# Patient Record
Sex: Male | Born: 1975 | Race: Black or African American | Hispanic: No | Marital: Single | Smoking: Never smoker
Health system: Southern US, Community
[De-identification: ages and names within clinical notes are randomized; demographics above are authoritative.]

## PROBLEM LIST (undated history)

## (undated) DIAGNOSIS — J45909 Unspecified asthma, uncomplicated: Secondary | ICD-10-CM

## (undated) HISTORY — PX: HAND SURGERY: SHX662

---

## 1997-10-18 ENCOUNTER — Emergency Department (HOSPITAL_COMMUNITY): Admission: EM | Admit: 1997-10-18 | Discharge: 1997-10-18 | Payer: Self-pay | Admitting: Emergency Medicine

## 1997-12-22 ENCOUNTER — Encounter: Admission: RE | Admit: 1997-12-22 | Discharge: 1997-12-22 | Payer: Self-pay | Admitting: Family Medicine

## 1998-02-01 ENCOUNTER — Encounter: Admission: RE | Admit: 1998-02-01 | Discharge: 1998-02-01 | Payer: Self-pay | Admitting: Family Medicine

## 1998-02-03 ENCOUNTER — Encounter: Admission: RE | Admit: 1998-02-03 | Discharge: 1998-02-03 | Payer: Self-pay | Admitting: Family Medicine

## 1998-02-28 ENCOUNTER — Encounter: Admission: RE | Admit: 1998-02-28 | Discharge: 1998-02-28 | Payer: Self-pay | Admitting: Family Medicine

## 1998-10-09 ENCOUNTER — Emergency Department (HOSPITAL_COMMUNITY): Admission: EM | Admit: 1998-10-09 | Discharge: 1998-10-09 | Payer: Self-pay | Admitting: Emergency Medicine

## 1998-10-09 ENCOUNTER — Encounter: Payer: Self-pay | Admitting: Emergency Medicine

## 1998-11-10 ENCOUNTER — Emergency Department (HOSPITAL_COMMUNITY): Admission: EM | Admit: 1998-11-10 | Discharge: 1998-11-10 | Payer: Self-pay | Admitting: Emergency Medicine

## 1998-11-21 ENCOUNTER — Encounter: Admission: RE | Admit: 1998-11-21 | Discharge: 1998-11-21 | Payer: Self-pay | Admitting: Family Medicine

## 1998-12-06 ENCOUNTER — Encounter: Admission: RE | Admit: 1998-12-06 | Discharge: 1998-12-06 | Payer: Self-pay | Admitting: Family Medicine

## 1998-12-13 ENCOUNTER — Emergency Department (HOSPITAL_COMMUNITY): Admission: EM | Admit: 1998-12-13 | Discharge: 1998-12-13 | Payer: Self-pay | Admitting: Emergency Medicine

## 1998-12-13 ENCOUNTER — Encounter: Payer: Self-pay | Admitting: Emergency Medicine

## 1999-02-10 ENCOUNTER — Emergency Department (HOSPITAL_COMMUNITY): Admission: EM | Admit: 1999-02-10 | Discharge: 1999-02-10 | Payer: Self-pay | Admitting: *Deleted

## 1999-05-03 ENCOUNTER — Encounter: Admission: RE | Admit: 1999-05-03 | Discharge: 1999-05-03 | Payer: Self-pay | Admitting: Family Medicine

## 2000-03-26 ENCOUNTER — Emergency Department (HOSPITAL_COMMUNITY): Admission: EM | Admit: 2000-03-26 | Discharge: 2000-03-26 | Payer: Self-pay

## 2004-04-03 ENCOUNTER — Emergency Department (HOSPITAL_COMMUNITY): Admission: EM | Admit: 2004-04-03 | Discharge: 2004-04-03 | Payer: Self-pay | Admitting: Emergency Medicine

## 2004-05-02 ENCOUNTER — Emergency Department (HOSPITAL_COMMUNITY): Admission: EM | Admit: 2004-05-02 | Discharge: 2004-05-02 | Payer: Self-pay | Admitting: Family Medicine

## 2005-04-02 ENCOUNTER — Emergency Department (HOSPITAL_COMMUNITY): Admission: EM | Admit: 2005-04-02 | Discharge: 2005-04-02 | Payer: Self-pay | Admitting: Emergency Medicine

## 2005-04-04 ENCOUNTER — Emergency Department (HOSPITAL_COMMUNITY): Admission: EM | Admit: 2005-04-04 | Discharge: 2005-04-04 | Payer: Self-pay | Admitting: Emergency Medicine

## 2006-03-31 ENCOUNTER — Emergency Department (HOSPITAL_COMMUNITY): Admission: EM | Admit: 2006-03-31 | Discharge: 2006-03-31 | Payer: Self-pay | Admitting: Emergency Medicine

## 2007-07-10 ENCOUNTER — Emergency Department (HOSPITAL_COMMUNITY): Admission: EM | Admit: 2007-07-10 | Discharge: 2007-07-10 | Payer: Self-pay | Admitting: Emergency Medicine

## 2007-08-15 ENCOUNTER — Emergency Department (HOSPITAL_COMMUNITY): Admission: EM | Admit: 2007-08-15 | Discharge: 2007-08-16 | Payer: Self-pay | Admitting: Emergency Medicine

## 2007-08-21 ENCOUNTER — Emergency Department (HOSPITAL_COMMUNITY): Admission: EM | Admit: 2007-08-21 | Discharge: 2007-08-21 | Payer: Self-pay | Admitting: Emergency Medicine

## 2007-12-09 ENCOUNTER — Emergency Department (HOSPITAL_COMMUNITY): Admission: EM | Admit: 2007-12-09 | Discharge: 2007-12-09 | Payer: Self-pay | Admitting: Family Medicine

## 2009-05-13 ENCOUNTER — Emergency Department (HOSPITAL_COMMUNITY): Admission: EM | Admit: 2009-05-13 | Discharge: 2009-05-13 | Payer: Self-pay | Admitting: Emergency Medicine

## 2009-09-30 ENCOUNTER — Emergency Department (HOSPITAL_COMMUNITY): Admission: EM | Admit: 2009-09-30 | Discharge: 2009-09-30 | Payer: Self-pay | Admitting: Emergency Medicine

## 2009-10-23 ENCOUNTER — Emergency Department (HOSPITAL_COMMUNITY): Admission: EM | Admit: 2009-10-23 | Discharge: 2009-10-23 | Payer: Self-pay | Admitting: Emergency Medicine

## 2010-07-15 LAB — URINALYSIS, ROUTINE W REFLEX MICROSCOPIC
Bilirubin Urine: NEGATIVE
Glucose, UA: NEGATIVE mg/dL
Ketones, ur: NEGATIVE mg/dL
Leukocytes, UA: NEGATIVE
Nitrite: NEGATIVE
Protein, ur: NEGATIVE mg/dL
Specific Gravity, Urine: 1.01 (ref 1.005–1.030)
Urobilinogen, UA: 1 mg/dL (ref 0.0–1.0)
pH: 7.5 (ref 5.0–8.0)

## 2010-07-15 LAB — URINE MICROSCOPIC-ADD ON

## 2010-07-15 LAB — RAPID STREP SCREEN (MED CTR MEBANE ONLY): Streptococcus, Group A Screen (Direct): POSITIVE — AB

## 2010-11-07 ENCOUNTER — Emergency Department (HOSPITAL_COMMUNITY)
Admission: EM | Admit: 2010-11-07 | Discharge: 2010-11-07 | Disposition: A | Discharge status: Home / Self Care | Payer: Self-pay | Attending: Emergency Medicine | Admitting: Emergency Medicine

## 2010-11-07 DIAGNOSIS — J45909 Unspecified asthma, uncomplicated: Secondary | ICD-10-CM | POA: Insufficient documentation

## 2010-11-07 DIAGNOSIS — J069 Acute upper respiratory infection, unspecified: Secondary | ICD-10-CM | POA: Insufficient documentation

## 2013-07-04 ENCOUNTER — Emergency Department (HOSPITAL_COMMUNITY): Payer: Self-pay

## 2013-07-04 ENCOUNTER — Encounter (HOSPITAL_COMMUNITY): Payer: Self-pay | Admitting: Emergency Medicine

## 2013-07-04 ENCOUNTER — Emergency Department (HOSPITAL_COMMUNITY)
Admission: EM | Admit: 2013-07-04 | Discharge: 2013-07-05 | Disposition: A | Discharge status: Home / Self Care | Payer: Self-pay | Attending: Emergency Medicine | Admitting: Emergency Medicine

## 2013-07-04 DIAGNOSIS — R05 Cough: Secondary | ICD-10-CM | POA: Insufficient documentation

## 2013-07-04 DIAGNOSIS — B349 Viral infection, unspecified: Secondary | ICD-10-CM

## 2013-07-04 DIAGNOSIS — R52 Pain, unspecified: Secondary | ICD-10-CM | POA: Insufficient documentation

## 2013-07-04 DIAGNOSIS — B9789 Other viral agents as the cause of diseases classified elsewhere: Secondary | ICD-10-CM | POA: Insufficient documentation

## 2013-07-04 DIAGNOSIS — R059 Cough, unspecified: Secondary | ICD-10-CM | POA: Insufficient documentation

## 2013-07-04 DIAGNOSIS — K1370 Unspecified lesions of oral mucosa: Secondary | ICD-10-CM | POA: Insufficient documentation

## 2013-07-04 DIAGNOSIS — R6889 Other general symptoms and signs: Secondary | ICD-10-CM | POA: Insufficient documentation

## 2013-07-04 DIAGNOSIS — J45909 Unspecified asthma, uncomplicated: Secondary | ICD-10-CM | POA: Insufficient documentation

## 2013-07-04 HISTORY — DX: Unspecified asthma, uncomplicated: J45.909

## 2013-07-04 LAB — CBC WITH DIFFERENTIAL/PLATELET
BASOS ABS: 0 10*3/uL (ref 0.0–0.1)
Basophils Relative: 0 % (ref 0–1)
Eosinophils Absolute: 0.1 10*3/uL (ref 0.0–0.7)
Eosinophils Relative: 1 % (ref 0–5)
HCT: 40.4 % (ref 39.0–52.0)
Hemoglobin: 13.8 g/dL (ref 13.0–17.0)
LYMPHS PCT: 24 % (ref 12–46)
Lymphs Abs: 1.3 10*3/uL (ref 0.7–4.0)
MCH: 29.7 pg (ref 26.0–34.0)
MCHC: 34.2 g/dL (ref 30.0–36.0)
MCV: 86.9 fL (ref 78.0–100.0)
Monocytes Absolute: 0.9 10*3/uL (ref 0.1–1.0)
Monocytes Relative: 16 % — ABNORMAL HIGH (ref 3–12)
NEUTROS ABS: 3.2 10*3/uL (ref 1.7–7.7)
NEUTROS PCT: 58 % (ref 43–77)
PLATELETS: 209 10*3/uL (ref 150–400)
RBC: 4.65 MIL/uL (ref 4.22–5.81)
RDW: 13.2 % (ref 11.5–15.5)
WBC: 5.5 10*3/uL (ref 4.0–10.5)

## 2013-07-04 LAB — BASIC METABOLIC PANEL
BUN: 9 mg/dL (ref 6–23)
BUN: 11 mg/dL (ref 6–23)
CALCIUM: 8.9 mg/dL (ref 8.4–10.5)
CHLORIDE: 100 meq/L (ref 96–112)
CO2: 25 mEq/L (ref 19–32)
CO2: 23 mEq/L (ref 19–32)
CREATININE: 1.32 mg/dL (ref 0.50–1.35)
Calcium: 8.9 mg/dL (ref 8.4–10.5)
Chloride: 101 mEq/L (ref 96–112)
Creatinine, Ser: 1.31 mg/dL (ref 0.50–1.35)
GFR calc Af Amer: 80 mL/min — ABNORMAL LOW (ref >90)
GFR calc non Af Amer: 69 mL/min — ABNORMAL LOW (ref >90)
GFR calc non Af Amer: 69 mL/min — ABNORMAL LOW (ref >90)
GFR, EST AFRICAN AMERICAN: 80 mL/min — AB (ref >90)
Glucose, Bld: 127 mg/dL — ABNORMAL HIGH (ref 70–99)
Glucose, Bld: 114 mg/dL — ABNORMAL HIGH (ref 70–99)
Potassium: 3.6 mEq/L — ABNORMAL LOW (ref 3.7–5.3)
Potassium: 3.7 mEq/L (ref 3.7–5.3)
SODIUM: 138 meq/L (ref 137–147)
Sodium: 138 mEq/L (ref 137–147)

## 2013-07-04 LAB — URINALYSIS, ROUTINE W REFLEX MICROSCOPIC
BILIRUBIN URINE: NEGATIVE
Glucose, UA: NEGATIVE mg/dL
Hgb urine dipstick: NEGATIVE
KETONES UR: NEGATIVE mg/dL
Leukocytes, UA: NEGATIVE
NITRITE: NEGATIVE
PH: 8 (ref 5.0–8.0)
PROTEIN: 100 mg/dL — AB
Specific Gravity, Urine: 1.022 (ref 1.005–1.030)
Urobilinogen, UA: 1 mg/dL (ref 0.0–1.0)

## 2013-07-04 LAB — RAPID STREP SCREEN (MED CTR MEBANE ONLY): Streptococcus, Group A Screen (Direct): NEGATIVE

## 2013-07-04 LAB — URINE MICROSCOPIC-ADD ON

## 2013-07-04 MED ORDER — KETOROLAC TROMETHAMINE 30 MG/ML IJ SOLN
60.0000 mg | Freq: Once | INTRAMUSCULAR | Status: AC
  Administered 2013-07-04: 60 mg via INTRAVENOUS
  Filled 2013-07-04: qty 2

## 2013-07-04 MED ORDER — ACETAMINOPHEN 325 MG PO TABS
650.0000 mg | ORAL_TABLET | Freq: Once | ORAL | Status: AC
  Administered 2013-07-04: 650 mg via ORAL
  Filled 2013-07-04: qty 2

## 2013-07-04 MED ORDER — SODIUM CHLORIDE 0.9 % IV BOLUS (SEPSIS)
1000.0000 mL | Freq: Once | INTRAVENOUS | Status: AC
  Administered 2013-07-04: 1000 mL via INTRAVENOUS

## 2013-07-04 MED ORDER — ONDANSETRON 4 MG PO TBDP
8.0000 mg | ORAL_TABLET | Freq: Once | ORAL | Status: AC
  Administered 2013-07-04: 8 mg via ORAL
  Filled 2013-07-04: qty 2

## 2013-07-04 MED ORDER — IBUPROFEN 200 MG PO TABS
400.0000 mg | ORAL_TABLET | Freq: Once | ORAL | Status: AC
  Administered 2013-07-04: 400 mg via ORAL
  Filled 2013-07-04: qty 2

## 2013-07-04 NOTE — ED Notes (Signed)
Blood re-obtained and sent to lab.

## 2013-07-04 NOTE — ED Notes (Signed)
C/o flu like sx: body aches, nasal & chest congestion, dry cough, burns with cough, mild sore throat, dizziness, fever noted at triage, last BM this am (normal).  (denies nvd). No relief with mucinex taken PTA.

## 2013-07-04 NOTE — ED Notes (Signed)
Parker, PA at bedside.  

## 2013-07-04 NOTE — ED Provider Notes (Signed)
CSN: [id number]     Arrival date & time 07/04/13  1931 History   First MD Initiated Contact with Patient 07/04/13 2133     Chief Complaint  Patient presents with  . Generalized Body Aches  . Fever     (Consider location/radiation/quality/duration/timing/severity/associated sxs/prior Treatment) HPI Comments: Daniel Mcintosh is a 38 y.o. male with a past medical history of Asthma presenting the Emergency Department with a chief complaint of generalized body aches, cough, and fever.  The patient reports congestion started a few days ago and has taken Mucinex without full resolution of symptoms. He reports productive cough and associated chest discomfort with coughing. He reports subject fever with associated chills today. Denies abdominal pain, nausea, vomiting, diarrhea.  The history is provided by the patient and medical records. No language interpreter was used.    Past Medical History  Diagnosis Date  . Asthma    Past Surgical History  Procedure Laterality Date  . Hand surgery Right    No family history on file. History  Substance Use Topics  . Smoking status: Never Smoker   . Smokeless tobacco: Not on file  . Alcohol Use: Yes    Review of Systems  Constitutional: Positive for fever and chills.  HENT: Positive for congestion, rhinorrhea, sinus pressure and sneezing. Negative for ear pain, mouth sores and trouble swallowing.   Respiratory: Positive for cough. Negative for shortness of breath and wheezing.   Musculoskeletal: Positive for myalgias. Negative for neck pain and neck stiffness.  Skin: Negative for rash.      Allergies  Shrimp  Home Medications   Current Outpatient Rx  Name  Route  Sig  Dispense  Refill  . guaiFENesin (MUCINEX) 600 MG 12 hr tablet   Oral   Take 600 mg by mouth 2 (two) times daily as needed for cough or to loosen phlegm.         Marland Kitchen oseltamivir (TAMIFLU) 75 MG capsule   Oral   Take 1 capsule (75 mg total) by mouth every 12 (twelve)  hours.   10 capsule   0    BP 101/64  Pulse 86  Temp(Src) 97.9 F (36.6 C) (Oral)  Resp 18  SpO2 99% Physical Exam  Nursing note and vitals reviewed. Constitutional: He is oriented to person, place, and time. He appears well-developed and well-nourished. No distress.  HENT:  Head: Normocephalic and atraumatic.  Right Ear: Tympanic membrane and ear canal normal.  Left Ear: Tympanic membrane and ear canal normal.  Nose: Rhinorrhea present.  Mouth/Throat: Uvula is midline and mucous membranes are normal. No trismus in the jaw. Posterior oropharyngeal erythema present.  Neck: Neck supple.  Cardiovascular: Normal rate, regular rhythm and normal heart sounds.   Pulses:      Radial pulses are 2+ on the right side, and 2+ on the left side.  Pulmonary/Chest: Effort normal. No respiratory distress. He has no wheezes. He has no rales.  Abdominal: Soft. There is no tenderness. There is no rigidity and no guarding.  Lymphadenopathy:       Head (right side): No submental, no submandibular, no tonsillar, no preauricular, no posterior auricular and no occipital adenopathy present.       Head (left side): No submental, no submandibular, no tonsillar, no preauricular, no posterior auricular and no occipital adenopathy present.    He has no cervical adenopathy.  Neurological: He is oriented to person, place, and time.  Skin: Skin is warm and dry.  Psychiatric: He has a normal  mood and affect. His behavior is normal.    ED Course  Procedures (including critical care time) Labs Review Labs Reviewed  BASIC METABOLIC PANEL - Abnormal; Notable for the following:    Potassium 3.6 (*)    Glucose, Bld 127 (*)    GFR calc non Af Amer 69 (*)    GFR calc Af Amer 80 (*)    All other components within normal limits  URINALYSIS, ROUTINE W REFLEX MICROSCOPIC - Abnormal; Notable for the following:    Protein, ur 100 (*)    All other components within normal limits  BASIC METABOLIC PANEL - Abnormal;  Notable for the following:    Glucose, Bld 114 (*)    GFR calc non Af Amer 69 (*)    GFR calc Af Amer 80 (*)    All other components within normal limits  CBC WITH DIFFERENTIAL - Abnormal; Notable for the following:    Monocytes Relative 16 (*)    All other components within normal limits  RAPID STREP SCREEN  CULTURE, GROUP A STREP  URINE MICROSCOPIC-ADD ON  CBC WITH DIFFERENTIAL   Imaging Review Dg Chest 2 View  07/04/2013   CLINICAL DATA:  Cough and congestion and fever for 3 days.  EXAM: CHEST  2 VIEW  COMPARISON:  None.  FINDINGS: The heart size and mediastinal contours are within normal limits. Both lungs are clear. The visualized skeletal structures are unremarkable.  IMPRESSION: No active cardiopulmonary disease.   Electronically Signed   By: Lajean Manes M.D.   On: 07/04/2013 21:04     EKG Interpretation   Date/Time:  Sunday July 04 2013 21:22:45 EDT Ventricular Rate:  102 PR Interval:  171 QRS Duration: 87 QT Interval:  335 QTC Calculation: 436 R Axis:   75 Text Interpretation:  Sinus tachycardia ST elev, probable normal early  repol pattern No significant change since last tracing Confirmed by [facility]   MD, Jenny Reichmann ([id number]) on 07/05/2013 1:22:22 AM      MDM   Final diagnoses:  Viral illness   Pt with fever, 102.5 on arrival, generalized body aches.  Likely viral.  Toradol and fluids given.  Labs, EKG, chest XR, UA, Rapid strep, EKG reviewed.  Discussed lab results, imaging results, and treatment plan with the patient. Return precautions given. Reports understanding and no other concerns at this time.  Patient is stable for discharge at this time.  Meds given in ED:  Medications  acetaminophen (TYLENOL) tablet 650 mg (650 mg Oral Given 07/04/13 1952)  ibuprofen (ADVIL,MOTRIN) tablet 400 mg (400 mg Oral Given 07/04/13 1952)  ondansetron (ZOFRAN-ODT) disintegrating tablet 8 mg (8 mg Oral Given 07/04/13 1953)  ketorolac (TORADOL) 30 MG/ML injection 60 mg (60 mg Intravenous  Given 07/04/13 2257)  sodium chloride 0.9 % bolus 1,000 mL (0 mLs Intravenous Stopped 07/04/13 2357)    Discharge Medication List as of 07/05/2013 12:50 AM    START taking these medications   Details  oseltamivir (TAMIFLU) 75 MG capsule Take 1 capsule (75 mg total) by mouth every 12 (twelve) hours., Starting 07/05/2013, Until Discontinued, Print            Lorrine Kin, PA-C 07/06/13 [phone]

## 2013-07-04 NOTE — ED Notes (Signed)
Pt vomited with strep swab

## 2013-07-05 MED ORDER — OSELTAMIVIR PHOSPHATE 75 MG PO CAPS: 75.0000 mg | ORAL_CAPSULE | Freq: Two times a day (BID) | ORAL | Status: DC

## 2013-07-05 NOTE — Discharge Instructions (Signed)
Call for a follow up appointment with a Family or Primary Care Provider.  Return if Symptoms worsen.   Take medication as prescribed.  Drink plenty of fluids. Take Ibuprofen your your body aches and fever.

## 2013-07-05 NOTE — ED Provider Notes (Signed)
Medical screening examination/treatment/procedure(s) were performed by non-physician practitioner and as supervising physician I was immediately available for consultation/collaboration.   EKG Interpretation   Date/Time:  Sunday July 04 2013 21:22:45 EDT Ventricular Rate:  102 PR Interval:  171 QRS Duration: 87 QT Interval:  335 QTC Calculation: 436 R Axis:   75 Text Interpretation:  Sinus tachycardia ST elev, probable normal early  repol pattern No significant change since last tracing Confirmed by [facility]   MD, Jenny Reichmann ([id number]) on 07/05/2013 1:22:22 AM       Babette Relic, MD 07/05/13 0202

## 2013-07-06 LAB — CULTURE, GROUP A STREP

## 2013-07-16 NOTE — ED Provider Notes (Signed)
Medical screening examination/treatment/procedure(s) were performed by non-physician practitioner and as supervising physician I was immediately available for consultation/collaboration.   EKG Interpretation None       Babette Relic, MD 07/16/13 [phone]

## 2014-07-06 ENCOUNTER — Emergency Department (HOSPITAL_COMMUNITY)
Admission: EM | Admit: 2014-07-06 | Discharge: 2014-07-06 | Disposition: A | Discharge status: Home / Self Care | Payer: Self-pay | Attending: Emergency Medicine | Admitting: Emergency Medicine

## 2014-07-06 ENCOUNTER — Emergency Department (HOSPITAL_COMMUNITY): Payer: Self-pay

## 2014-07-06 ENCOUNTER — Encounter (HOSPITAL_COMMUNITY): Payer: Self-pay | Admitting: *Deleted

## 2014-07-06 DIAGNOSIS — Z79899 Other long term (current) drug therapy: Secondary | ICD-10-CM | POA: Insufficient documentation

## 2014-07-06 DIAGNOSIS — J45909 Unspecified asthma, uncomplicated: Secondary | ICD-10-CM | POA: Insufficient documentation

## 2014-07-06 DIAGNOSIS — J069 Acute upper respiratory infection, unspecified: Secondary | ICD-10-CM | POA: Insufficient documentation

## 2014-07-06 MED ORDER — ALBUTEROL SULFATE HFA 108 (90 BASE) MCG/ACT IN AERS: 2.0000 | INHALATION_SPRAY | RESPIRATORY_TRACT | Status: AC | PRN

## 2014-07-06 NOTE — ED Provider Notes (Signed)
CSN: [id number]     Arrival date & time 07/06/14  2001 History  This chart was scribed for non-physician practitioner working with Debby Freiberg, MD by Molli Posey, ED Scribe. This patient was seen in room TR06C/TR06C and the patient's care was started at 9:03 PM.    Chief Complaint  Patient presents with  . Cough   The history is provided by the patient. No language interpreter was used.   HPI Comments: Daniel Mcintosh is a 39 y.o. male with a history of asthma who presents to the Emergency Department complaining of a productive cough with clear mucous for the last 2 days. He reports back pain between his shoulder blades that worsens when he moves too quickly or when he coughs. Pt states he has been taking robitussin flu for two days with minimal relief. He states that he does not have an inhaler at home and says his asthma typically does not bother him unless he has a cold. Pt denies fever, sore throat, rhinorrhea, body aches and ear pain.    Past Medical History  Diagnosis Date  . Asthma    Past Surgical History  Procedure Laterality Date  . Hand surgery Right    No family history on file. History  Substance Use Topics  . Smoking status: Never Smoker   . Smokeless tobacco: Not on file  . Alcohol Use: Yes    Review of Systems  Constitutional: Negative for fever and chills.  HENT: Negative for ear pain, rhinorrhea and sore throat.   Respiratory: Positive for cough.   Musculoskeletal: Positive for back pain. Negative for myalgias.    Allergies  Shrimp  Home Medications   Prior to Admission medications   Medication Sig Start Date End Date Taking? Authorizing Provider  albuterol (PROVENTIL HFA;VENTOLIN HFA) 108 (90 BASE) MCG/ACT inhaler Inhale 2 puffs into the lungs every 4 (four) hours as needed for wheezing or shortness of breath. 07/06/14   Al Corpus, PA-C  guaiFENesin (MUCINEX) 600 MG 12 hr tablet Take 600 mg by mouth 2 (two) times daily as needed for cough or  to loosen phlegm.    Historical Provider, MD  oseltamivir (TAMIFLU) 75 MG capsule Take 1 capsule (75 mg total) by mouth every 12 (twelve) hours. 07/05/13   Lauren Parker, PA-C   BP 129/76 mmHg  Pulse 79  Temp(Src) 98.1 F (36.7 C) (Oral)  Resp 18  Ht 5\' 11"  (1.803 m)  Wt 240 lb (108.863 kg)  BMI 33.49 kg/m2  SpO2 96% Physical Exam  Constitutional: He appears well-developed and well-nourished. No distress.  HENT:  Head: Normocephalic and atraumatic.  Nose: Right sinus exhibits no maxillary sinus tenderness and no frontal sinus tenderness. Left sinus exhibits no maxillary sinus tenderness and no frontal sinus tenderness.  Mouth/Throat: Mucous membranes are normal. No oropharyngeal exudate, posterior oropharyngeal edema or posterior oropharyngeal erythema.  Eyes: Conjunctivae and EOM are normal. Right eye exhibits no discharge. Left eye exhibits no discharge.  Neck: Normal range of motion. Neck supple.  Cardiovascular: Normal rate, regular rhythm and normal heart sounds.   Pulmonary/Chest: Effort normal and breath sounds normal. No respiratory distress. He has no wheezes. He has no rales.  Abdominal: Soft. Bowel sounds are normal. He exhibits no distension. There is no tenderness.  Lymphadenopathy:    He has cervical adenopathy.  Neurological: He is alert.  Skin: Skin is warm and dry. He is not diaphoretic.  Nursing note and vitals reviewed.   ED Course  Procedures   DIAGNOSTIC  STUDIES: Oxygen Saturation is 97% on RA, normal by my interpretation.    COORDINATION OF CARE: 9:07 PM Discussed treatment plan with pt at bedside and pt agreed to plan.   Labs Review Labs Reviewed - No data to display  Imaging Review Dg Chest 2 View  07/06/2014   CLINICAL DATA:  Productive cough for 2 days. Bilateral posterior chest pain. History of asthma.  EXAM: CHEST  2 VIEW  COMPARISON:  07/04/2013  FINDINGS: The heart size and mediastinal contours are within normal limits. Both lungs are clear. The  visualized skeletal structures are unremarkable.  IMPRESSION: No active cardiopulmonary disease.   Electronically Signed   By: Lucienne Capers M.D.   On: 07/06/2014 20:51     EKG Interpretation None      Meds given in ED:  Medications - No data to display  Discharge Medication List as of 07/06/2014  9:10 PM    START taking these medications   Details  albuterol (PROVENTIL HFA;VENTOLIN HFA) 108 (90 BASE) MCG/ACT inhaler Inhale 2 puffs into the lungs every 4 (four) hours as needed for wheezing or shortness of breath., Starting 07/06/2014, Until Discontinued, Print          MDM   Final diagnoses:  URI (upper respiratory infection)   Pt CXR negative for acute infiltrate. Patients symptoms are consistent with URI, likely viral etiology. Discussed that antibiotics are not indicated for viral infections. Patient with history of asthma without home inhaler. He's been given a prescription for this. Pt will be discharged with symptomatic treatment.  Pt is hemodynamically stable & in NAD prior to dc.  Discussed return precautions with patient. Discussed all results and patient verbalizes understanding and agrees with plan.  I personally performed the services described in this documentation, which was scribed in my presence. The recorded information has been reviewed and is accurate.   Al Corpus, PA-C 07/06/14 2215  Debby Freiberg, MD 07/06/14 2222

## 2014-07-06 NOTE — Discharge Instructions (Signed)
Return to the emergency room with worsening of symptoms, new symptoms or with symptoms that are concerning , especially fevers, stiff neck, worsening headache, nausea/vomiting, visual changes or slurred speech, chest pain, shortness of breath, cough with thick colored mucous or blood Drink plenty of fluids with electrolytes especially Gatorade. OTC cold medications such as mucinex, nyquil, dayquil are recommended. Chloraseptic for sore throat. Use your inhaler for chest tightness or shortness of breath. Call or make an appointment with the wellness center to establish care and follow-up for persistent symptoms. Read below information and follow recommendations.   Cough, Adult  A cough is a reflex that helps clear your throat and airways. It can help heal the body or may be a reaction to an irritated airway. A cough may only last 2 or 3 weeks (acute) or may last more than 8 weeks (chronic).  CAUSES Acute cough:  Viral or bacterial infections. Chronic cough:  Infections.  Allergies.  Asthma.  Post-nasal drip.  Smoking.  Heartburn or acid reflux.  Some medicines.  Chronic lung problems (COPD).  Cancer. SYMPTOMS   Cough.  Fever.  Chest pain.  Increased breathing rate.  High-pitched whistling sound when breathing (wheezing).  Colored mucus that you cough up (sputum). TREATMENT   A bacterial cough may be treated with antibiotic medicine.  A viral cough must run its course and will not respond to antibiotics.  Your caregiver may recommend other treatments if you have a chronic cough. HOME CARE INSTRUCTIONS   Only take over-the-counter or prescription medicines for pain, discomfort, or fever as directed by your caregiver. Use cough suppressants only as directed by your caregiver.  Use a cold steam vaporizer or humidifier in your bedroom or home to help loosen secretions.  Sleep in a semi-upright position if your cough is worse at night.  Rest as needed.  Stop  smoking if you smoke. SEEK IMMEDIATE MEDICAL CARE IF:   You have pus in your sputum.  Your cough starts to worsen.  You cannot control your cough with suppressants and are losing sleep.  You begin coughing up blood.  You have difficulty breathing.  You develop pain which is getting worse or is uncontrolled with medicine.  You have a fever. MAKE SURE YOU:   Understand these instructions.  Will watch your condition.  Will get help right away if you are not doing well or get worse. Document Released: 10/12/2010 Document Revised: 07/08/2011 Document Reviewed: 10/12/2010 [facility] Patient Information 2015 [place], [place]. This information is not intended to replace advice given to you by your health care provider. Make sure you discuss any questions you have with your health care provider.

## 2014-07-06 NOTE — ED Notes (Signed)
The pt has had a cold and a cough for 2 days.  Non-productive bu he hurts in his posterior chest when he coughs.  No temp

## 2014-07-06 NOTE — ED Notes (Signed)
Pt sts he has been taking Robitussin Flu (CF) for two days.  Denies nasal congestion except at night when he lays down to go to sleep.    Sts he has pain between his shoulders blades if he "moves too quick" at home.  Sts cough is weak and only became productive today with clear mucous.    Pt denies any body aches, fevers, earaches, etc.  Sts he had a headache when the cough started, but he took a "BC powder" and has not had any headache since.    Pt sts "my girlfriend says my cough gets worse while I'm sleeping."

## 2014-11-09 ENCOUNTER — Emergency Department (HOSPITAL_COMMUNITY)
Admission: EM | Admit: 2014-11-09 | Discharge: 2014-11-09 | Disposition: A | Discharge status: Home / Self Care | Payer: Self-pay | Attending: Emergency Medicine | Admitting: Emergency Medicine

## 2014-11-09 ENCOUNTER — Encounter (HOSPITAL_COMMUNITY): Payer: Self-pay

## 2014-11-09 DIAGNOSIS — R112 Nausea with vomiting, unspecified: Secondary | ICD-10-CM | POA: Insufficient documentation

## 2014-11-09 DIAGNOSIS — R000 Tachycardia, unspecified: Secondary | ICD-10-CM | POA: Insufficient documentation

## 2014-11-09 DIAGNOSIS — M545 Low back pain: Secondary | ICD-10-CM | POA: Insufficient documentation

## 2014-11-09 DIAGNOSIS — J45909 Unspecified asthma, uncomplicated: Secondary | ICD-10-CM | POA: Insufficient documentation

## 2014-11-09 DIAGNOSIS — R509 Fever, unspecified: Secondary | ICD-10-CM | POA: Insufficient documentation

## 2014-11-09 DIAGNOSIS — J029 Acute pharyngitis, unspecified: Secondary | ICD-10-CM | POA: Insufficient documentation

## 2014-11-09 DIAGNOSIS — M791 Myalgia: Secondary | ICD-10-CM | POA: Insufficient documentation

## 2014-11-09 DIAGNOSIS — R1084 Generalized abdominal pain: Secondary | ICD-10-CM | POA: Insufficient documentation

## 2014-11-09 DIAGNOSIS — R197 Diarrhea, unspecified: Secondary | ICD-10-CM | POA: Insufficient documentation

## 2014-11-09 LAB — LIPASE, BLOOD: Lipase: 27 U/L (ref 22–51)

## 2014-11-09 LAB — CBC
HCT: 41.4 % (ref 39.0–52.0)
HEMOGLOBIN: 13.9 g/dL (ref 13.0–17.0)
MCH: 29.4 pg (ref 26.0–34.0)
MCHC: 33.6 g/dL (ref 30.0–36.0)
MCV: 87.7 fL (ref 78.0–100.0)
Platelets: 240 10*3/uL (ref 150–400)
RBC: 4.72 MIL/uL (ref 4.22–5.81)
RDW: 12.9 % (ref 11.5–15.5)
WBC: 10.8 10*3/uL — AB (ref 4.0–10.5)

## 2014-11-09 LAB — URINALYSIS, ROUTINE W REFLEX MICROSCOPIC
Bilirubin Urine: NEGATIVE
Glucose, UA: NEGATIVE mg/dL
Ketones, ur: NEGATIVE mg/dL
Leukocytes, UA: NEGATIVE
Nitrite: NEGATIVE
Protein, ur: NEGATIVE mg/dL
Specific Gravity, Urine: 1.008 (ref 1.005–1.030)
Urobilinogen, UA: 1 mg/dL (ref 0.0–1.0)
pH: 7 (ref 5.0–8.0)

## 2014-11-09 LAB — COMPREHENSIVE METABOLIC PANEL
ALT: 26 U/L (ref 17–63)
AST: 25 U/L (ref 15–41)
Albumin: 3.8 g/dL (ref 3.5–5.0)
Alkaline Phosphatase: 61 U/L (ref 38–126)
Anion gap: 8 (ref 5–15)
BUN: 7 mg/dL (ref 6–20)
CALCIUM: 8.8 mg/dL — AB (ref 8.9–10.3)
CO2: 24 mmol/L (ref 22–32)
Chloride: 105 mmol/L (ref 101–111)
Creatinine, Ser: 1.2 mg/dL (ref 0.61–1.24)
GFR calc non Af Amer: >60 mL/min (ref >60)
Glucose, Bld: 127 mg/dL — ABNORMAL HIGH (ref 65–99)
Potassium: 3.9 mmol/L (ref 3.5–5.1)
SODIUM: 137 mmol/L (ref 135–145)
Total Bilirubin: 0.4 mg/dL (ref 0.3–1.2)
Total Protein: 6.9 g/dL (ref 6.5–8.1)

## 2014-11-09 LAB — URINE MICROSCOPIC-ADD ON

## 2014-11-09 MED ORDER — ACETAMINOPHEN 500 MG PO TABS: 500.0000 mg | ORAL_TABLET | Freq: Four times a day (QID) | ORAL | Status: AC | PRN

## 2014-11-09 MED ORDER — PROMETHAZINE HCL 25 MG PO TABS: 25.0000 mg | ORAL_TABLET | Freq: Four times a day (QID) | ORAL | Status: AC | PRN

## 2014-11-09 MED ORDER — ONDANSETRON HCL 4 MG/2ML IJ SOLN
4.0000 mg | Freq: Once | INTRAMUSCULAR | Status: AC
  Administered 2014-11-09: 4 mg via INTRAVENOUS
  Filled 2014-11-09: qty 2

## 2014-11-09 MED ORDER — MORPHINE SULFATE 4 MG/ML IJ SOLN
4.0000 mg | Freq: Once | INTRAMUSCULAR | Status: AC
  Administered 2014-11-09: 4 mg via INTRAVENOUS
  Filled 2014-11-09: qty 1

## 2014-11-09 MED ORDER — SODIUM CHLORIDE 0.9 % IV BOLUS (SEPSIS)
1000.0000 mL | Freq: Once | INTRAVENOUS | Status: AC
  Administered 2014-11-09: 1000 mL via INTRAVENOUS

## 2014-11-09 NOTE — ED Notes (Signed)
Pt states he thinks he has food poisoning cause he felt fine until he ate [place] food. 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

## 2014-11-09 NOTE — ED Provider Notes (Signed)
CSN: [id number]     Arrival date & time 11/09/14  0093 History   First MD Initiated Contact with Patient 11/09/14 [phone]     Chief Complaint  Patient presents with  . Abdominal Pain     (Consider location/radiation/quality/duration/timing/severity/associated sxs/prior Treatment) HPI  39 year old male with history of asthma presenting with complaint of abdominal pain. Patient reports gradual onset of achy left lower quadrant abdominal pain with associated sore throat, body aches, low back pain, nausea, and had loose stool which started last night. He described his abdominal pain as achy, 7 out of 10, nonradiating, persistent. Symptoms started shortly after he went out to eat at a [employer]. He denies having any recent sick contact. 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 Sore throat is with mild discomfort. No associated runny nose, sneezing, or coughing. No chest pain or shortness of breath. No dysuria, hematuria, or penile discharge. Denies penile pain or testicular tenderness. No associated rash. Nothing seems to make symptoms better or worse. No specific treatment tried. Patient reports subjective fever and chills and states symptoms felt similar to a prior influenza infection he had in the past. He denies any recent travel.  Past Medical History  Diagnosis Date  . Asthma    Past Surgical History  Procedure Laterality Date  . Hand surgery Right    No family history on file. History  Substance Use Topics  . Smoking status: Never Smoker   . Smokeless tobacco: Not on file  . Alcohol Use: Yes    Review of Systems  All other systems reviewed and are negative.     Allergies  Shrimp  Home Medications   Prior to Admission medications   Medication Sig Start Date End Date Taking? Authorizing Provider  albuterol (PROVENTIL HFA;VENTOLIN HFA) 108 (90 BASE) MCG/ACT inhaler Inhale 2 puffs into the lungs every 4 (four) hours  as needed for wheezing or shortness of breath. 07/06/14   Al Corpus, PA-C  guaiFENesin (MUCINEX) 600 MG 12 hr tablet Take 600 mg by mouth 2 (two) times daily as needed for cough or to loosen phlegm.    Historical Provider, MD  oseltamivir (TAMIFLU) 75 MG capsule Take 1 capsule (75 mg total) by mouth every 12 (twelve) hours. 07/05/13   Lauren Parker, PA-C   BP 133/89 mmHg  Pulse 111  Temp(Src) 99.6 F (37.6 C) (Oral)  Resp 24  SpO2 97% Physical Exam  Constitutional: He is oriented to person, place, and time. He appears well-developed and well-nourished. No distress.  African-American male, appears to be in no acute distress, nontoxic in appearance.  HENT:  Head: Atraumatic.  Mouth/Throat: Oropharynx is clear and moist.  Throat: Uvula is midline, no tonsillar enlargement or exudates, no trismus.  Eyes: Conjunctivae are normal.  Neck: Normal range of motion. Neck supple.  Cardiovascular:  Mild tachycardia without murmurs rubs or gallops  Pulmonary/Chest: Effort normal and breath sounds normal. No respiratory distress. He has no wheezes.  Abdominal: Soft. Bowel sounds are normal. He exhibits no distension. There is tenderness (mild left lower quadrant tenderness without guarding or rebound tenderness.). There is no rebound and no guarding.  Negative Murphy sign, no pain at McBurney's point.  Neurological: He is alert and oriented to person, place, and time.  Skin: No rash noted.  Psychiatric: He has a normal mood and affect.  Nursing note and vitals reviewed.   ED Course  Procedures (including critical care time)  Patient here  with low abdominal pain. He also has symptoms to suggest viral etiology. Abdomen without peritonitis concerning for acute abdomen. No testicular complaint, low suspicion for testicular torsion or STD. Doubt appendicitis. Patient is mildly tachycardic, IV fluid and pain medication given, basic labs ordered. We'll continue to monitor.  8:13 AM Patient felt much  better after receiving initial treatment. He is able to tolerates by mouth. Labs all reassuring. Trace of hemoglobin noted on urine dipstick but normal renal function. Patient has had asymptomatic hematuria is evident from prior records. Recommend patient to follow-up with a primary care provider for further workup if indicated. Otherwise suspect this is likely to be a viral etiology. Symptomatic treatment provided, return precaution discussed.  Labs Review Labs Reviewed  COMPREHENSIVE METABOLIC PANEL - Abnormal; Notable for the following:    Glucose, Bld 127 (*)    Calcium 8.8 (*)    All other components within normal limits  CBC - Abnormal; Notable for the following:    WBC 10.8 (*)    All other components within normal limits  URINALYSIS, ROUTINE W REFLEX [facility]CROSCOPIC (NOT [facility]) - Abnormal; Notable for the following:    Hgb urine dipstick SMALL (*)    All other components within normal limits  LIPASE, BLOOD  URINE MICROSCOPIC-ADD ON    Imaging Review No results found.   EKG Interpretation None      MDM   Final diagnoses:  Generalized abdominal pain  Nausea vomiting and diarrhea    BP 125/80 mmHg  Pulse 99  Temp(Src) 99.6 F (37.6 C) (Oral)  Resp 24  SpO2 99%  I have reviewed nursing notes and vital signs. I reviewed available ER/hospitalization records through the EMR     Domenic Moras, Hershal Coria 11/09/14 2637  April Palumbo, MD 11/09/14 2312

## 2014-11-09 NOTE — Discharge Instructions (Signed)
Your symptoms are likely due to a viral infection.  Take tylenol as needed for fever/pain.  Take Phenergan for nausea.  Use resources below to find a primary care provider for further care.  Return to ER if your condition worsen or if you have other concerns.   Abdominal Pain Many things can cause abdominal pain. Usually, abdominal pain is not caused by a disease and will improve without treatment. It can often be observed and treated at home. Your health care provider will do a physical exam and possibly order blood tests and X-rays to help determine the seriousness of your pain. However, in many cases, more time must pass before a clear cause of the pain can be found. Before that point, your health care provider may not know if you need more testing or further treatment. HOME CARE INSTRUCTIONS  Monitor your abdominal pain for any changes. The following actions may help to alleviate any discomfort you are experiencing:  Only take over-the-counter or prescription medicines as directed by your health care provider.  Do not take laxatives unless directed to do so by your health care provider.  Try a clear liquid diet (broth, tea, or water) as directed by your health care provider. Slowly move to a bland diet as tolerated. SEEK MEDICAL CARE IF:  You have unexplained abdominal pain.  You have abdominal pain associated with nausea or diarrhea.  You have pain when you urinate or have a bowel movement.  You experience abdominal pain that wakes you in the night.  You have abdominal pain that is worsened or improved by eating food.  You have abdominal pain that is worsened with eating fatty foods.  You have a fever. SEEK IMMEDIATE MEDICAL CARE IF:   Your pain does not go away within 2 hours.  You keep throwing up (vomiting).  Your pain is felt only in portions of the abdomen, such as the right side or the left lower portion of the abdomen.  You pass bloody or black tarry stools. MAKE SURE  YOU:  Understand these instructions.   Will watch your condition.   Will get help right away if you are not doing well or get worse.  Document Released: 01/23/2005 Document Revised: 04/20/2013 Document Reviewed: 12/23/2012 [facility] Patient Information 2015 [place], [place]. This information is not intended to replace advice given to you by your health care provider. Make sure you discuss any questions you have with your health care provider.   Emergency Department Resource Guide 1) Find a Doctor and Pay Out of Pocket Although you won't have to find out who is covered by your insurance plan, it is a good idea to ask around and get recommendations. You will then need to call the office and see if the doctor you have chosen will accept you as a new patient and what types of options they offer for patients who are self-pay. Some doctors offer discounts or will set up payment plans for their patients who do not have insurance, but you will need to ask so you aren't surprised when you get to your appointment.  2) Contact Your Local Health Department Not all health departments have doctors that can see patients for sick visits, but many do, so it is worth a call to see if yours does. If you don't know where your local health department is, you can check in your phone book. The CDC also has a tool to help you locate your state's health department, and many state websites also have  listings of all of their local health departments.  3) Find a [facility] If your illness is not likely to be very severe or complicated, you may want to try a walk in clinic. These are popping up all over the country in pharmacies, drugstores, and shopping centers. They're usually staffed by nurse practitioners or physician assistants that have been trained to treat common illnesses and complaints. They're usually fairly quick and inexpensive. However, if you have serious medical issues or chronic medical problems, these  are probably not your best option.  No Primary Care Doctor: - Call Health Connect at  [phone] - they can help you locate a primary care doctor that  accepts your insurance, provides certain services, etc. - Physician Referral Service- [phone]  Chronic Pain Problems: Organization         Address  Phone   Notes  [facility]  [phone] Patients need to be referred by their primary care doctor.   Medication Assistance: Organization         Address  Phone   Notes  [facility] Medication [facility] [place] [place], [place] [address] [phone] --Must be a resident of [facility] -- Must have NO insurance coverage whatsoever (no Medicaid/ Medicare, etc.) -- The pt. MUST have a primary care doctor that directs their care regularly and follows them in the community   [facility]ist  [phone]   [employer]  [phone]    Agencies that provide inexpensive medical care: Organization         Address  Phone   Notes  [place]  [phone]   [facility]    [phone]   [facility] [place], [place] [address] [phone]   [place] [address], [place] [phone]   Planned Parenthood    [phone]   [facility]    [phone]   [place] and [place], [place] Phone:  [phone], Fax:  [phone] Hours of Operation:  9 am - 6 pm, M-F.  Also accepts Medicaid/Medicare and self-pay.  [facility] for [place], [address], [place] Phone: [phone], Fax: [phone]. Hours of Operation:  8:30 am - 5:30 pm, M-F.  Also accepts Medicaid and self-pay.  [facility] [place] [address], [place] Phone: [phone]   [place], [place], [place] [phone], Ext. [phone] Mondays &  Thursdays: 7-9 AM.  First 15 patients are seen on a first come, first serve basis.    [place] Providers:  Organization         Address  Phone   Notes  [facility] [address], [place] [phone] Also accepts self-pay patients.  [facility] [address], [place]  [phone]   [place], [address], [place] [phone]   [facility] [address], [place] [phone]   Lucianne Lei [address], [place]   [phone] Only accepts [place] Access [place] patients after they have their name applied to their card.   Self-Pay (no insurance) in [facility]:  [employer]  Notes  Sickle Cell Patients, [place] [phone]   [facility] [place] [phone]   [facility] [place]  [place], [place], [place] [phone]   [facility]/Dr. Osei-Bonsu  [address], [place] or [address], [place] [phone] Phone number for both [place] and [place] locations is the same.  [facility] [address] [place] [phone]   [facility] [address] [place] or [address] [phone]   [facility] [address], [place] [phone], phone; [phone], fax Sees patients 1st and 3rd Saturday of every month.  Must not qualify for public or private insurance (i.e. Medicaid, Medicare, [place] Health Choice, Veterans' Benefits)  Household income should be no more than 200% of the poverty level The clinic cannot treat you if you are pregnant or think you are pregnant  Sexually transmitted diseases are not treated at the  clinic.    Dental Care: Organization         Address  Phone  Notes  [facility] Department of [facility] [phone] Accepts children up to age 72 who are enrolled in [place] or [place]; pregnant women with a Medicaid card; and children who have applied for Medicaid or [place] Health Choice, but were declined, whose parents can pay a reduced fee at time of service.  [facility]  [address], [place] [phone] Accepts children up to age 5 who are enrolled in [place] or [place]; pregnant women with a Medicaid card; and children who have applied for Medicaid or [place] Health Choice, but were declined, whose parents can pay a reduced fee at time of service.  [place] Adult Dental Access PROGRAM  [place] [phone] Patients are seen by appointment only. Walk-ins are not accepted. [place] will see patients 41 years of age and older. Monday - Tuesday (8am-5pm) Most Wednesdays (8:30-5pm) $30 per visit, cash only  [facility] Adult Dental Access PROGRAM  [address], Comprehensive Outpatient [place] [phone] Patients are seen by appointment only. Walk-ins are not accepted. [place] will see patients 68 years of age and older. One Wednesday Evening (Monthly: Volunteer Based).  $30 per visit, cash only  [place]  [phone] for adults; Children under age 65, call Graduate Pediatric Dentistry at [phone]. Children aged 52-14, please call [phone] to request a pediatric application.  Dental services are provided in all areas of dental care including fillings, crowns and bridges, complete and partial dentures, implants, gum treatment, root canals, and extractions. Preventive care is also provided. Treatment is provided to both adults and children. Patients are selected via a lottery and there is often a  waiting list.   [facility] [address] [place]  [phone] [email or web address]   [facility] [address] [place], [place] [phone], Ext. [phone] Second and Fourth Thursday of each month, opens at 6:30 AM; Clinic ends at 9 AM.  Patients are seen on a first-come first-served basis, and a limited number are seen during each clinic.   [facility]  [address] Hillard Danker [place], [place] [phone]  Eligibility Requirements You must have lived in [place], [place], or [place] counties for at least the last three months.   You cannot be eligible for state or federal sponsored [employer], including [employer], [place], or [employer].   You generally cannot be eligible for healthcare insurance through your employer.    How to apply: Eligibility screenings are held every Tuesday and Wednesday afternoon from 1:00 pm until 4:00 pm. You do not need an appointment for the interview!  [facility] [address] [place], [place]   [place]  [place] Department  Industry  [phone]    Behavioral Health Resources in the Community: Intensive Outpatient Programs Organization         Address  Phone  Notes  [place]. [address], [place], [place] [phone]   [facility] Outpatient [address], [place], [place]   ADS: [facility] Drug S[facility] [address], [place], [place]   [place] [address]  [place], [place] or [phone]   Substance Abuse Resources Organization         Address  Phone  Notes  Alcohol [facility] Drug Services  [phone]   [place]  [phone]   [facility] [place]   Chinita Pester  [phone]   Residential & Outpatient Substance Abuse  Program  [phone]   Psychological Services Organization         Address  Phone  Notes  [facility] [place]  [place]  [phone]   House [address] [place] or [phone]    Mobile Crisis Teams Organization         Address  Phone  Notes  [facility], Mobile Crisis Care Unit  [phone]   [facility]  [address] [place], [place]   [place] [address] [place] [phone]    Self-Help/Support Groups Organization         Address  Phone             Notes  [place]. of [place] - variety of support groups  Rayle Call for more information  Narcotics Anonymous (NA), [facility] [address], [employer] [place]  2 meetings at this location   Special educational needs teacher         Address  Phone  Notes  [facility] Residential Treatment [place],    [place]  [phone]   [facility]  [address] [place] [phone], [place], [place]   [place], [place] [phone] Admissions: 8am-3pm M-F  Incentives Substance [place] [address],    [place], [place] [phone]   [facility] [address] Jadene Pierini [place], [place]   [facility] [address]  [place], [place]   [facility] - Intensive Outpatient [address] Kristeen [address], [place], [employer]   [facility] ([place].) [place].,  [place], [place] or [phone]   [facility] ([facility]S) [address] [place], [place] Accepts Medicaid  [facility] [place] [address]  [place] [phone] Substance Abuse/Addiction Treatment   [facility] Resources Organization  Address  Phone  Notes  °[facility]  ([phone]) [phone]   °Julie Brannon, PhD [address] [place], [place]   ([phone] or (336) [phone]   °[facility]   [address] °[place], [place] ([phone]   °[facility] [address], [place], [place] [phone] Insurance/Medicaid/sponsorship through [facility]  °[facility] [address]                                    [place], [place] [phone] Therapy/tele-psych/case  °[facility] [address]  ° [place], [place] [phone]    °Dr. Arfeen  ([phone]   °[facility] [place]  [employer] [place] [facility] 1) [address], [place] °2) [address], [place] °3)  [address], [place] ([phone]) [phone] °(336) [phone] ° °([phone]) [phone]   °[place] Child Abuse Hotline (336) 342-1394 or (336) [phone] (After Hours)    ° ° ° °

## 2015-06-21 ENCOUNTER — Emergency Department (HOSPITAL_BASED_OUTPATIENT_CLINIC_OR_DEPARTMENT_OTHER): Payer: Self-pay

## 2015-06-21 ENCOUNTER — Encounter (HOSPITAL_BASED_OUTPATIENT_CLINIC_OR_DEPARTMENT_OTHER): Payer: Self-pay | Admitting: *Deleted

## 2015-06-21 ENCOUNTER — Emergency Department (HOSPITAL_COMMUNITY)
Admission: EM | Admit: 2015-06-21 | Discharge: 2015-06-21 | Disposition: A | Discharge status: Home / Self Care | Payer: Self-pay | Attending: Emergency Medicine | Admitting: Emergency Medicine

## 2015-06-21 ENCOUNTER — Encounter (HOSPITAL_COMMUNITY): Payer: Self-pay

## 2015-06-21 ENCOUNTER — Emergency Department (HOSPITAL_BASED_OUTPATIENT_CLINIC_OR_DEPARTMENT_OTHER)
Admission: EM | Admit: 2015-06-21 | Discharge: 2015-06-21 | Disposition: A | Discharge status: Home / Self Care | Payer: Self-pay | Attending: Emergency Medicine | Admitting: Emergency Medicine

## 2015-06-21 DIAGNOSIS — B349 Viral infection, unspecified: Secondary | ICD-10-CM | POA: Insufficient documentation

## 2015-06-21 DIAGNOSIS — Z79899 Other long term (current) drug therapy: Secondary | ICD-10-CM | POA: Insufficient documentation

## 2015-06-21 DIAGNOSIS — R0981 Nasal congestion: Secondary | ICD-10-CM | POA: Insufficient documentation

## 2015-06-21 DIAGNOSIS — J45909 Unspecified asthma, uncomplicated: Secondary | ICD-10-CM | POA: Insufficient documentation

## 2015-06-21 NOTE — ED Notes (Signed)
C/o nasal congestion that is draining and has been taking alkaseltzer for this. Feel like he can't cough it up.

## 2015-06-21 NOTE — ED Notes (Addendum)
Pt with nasal/chest congestion x 3 days. Thick mucous.  No n/v.  No fever. No chest pain

## 2015-06-21 NOTE — Discharge Instructions (Signed)
Please read and follow all provided instructions.  Your diagnoses today include:  1. Viral syndrome    Tests performed today include:  Vital signs. See below for your results today.   Medications prescribed:   None   Home care instructions:  Follow any educational materials contained in this packet.  Follow-up instructions: Please follow-up with your primary care provider in the next 48 hours for further evaluation of symptoms and treatment   Return instructions:   Please return to the Emergency Department if you do not get better, if you get worse, or new symptoms OR  - Fever (temperature greater than 101.74F)  - Bleeding that does not stop with holding pressure to the area    -Severe pain (please note that you may be more sore the day after your accident)  - Chest Pain  - Difficulty breathing  - Severe nausea or vomiting  - Inability to tolerate food and liquids  - Passing out  - Skin becoming red around your wounds  - Change in mental status (confusion or lethargy)  - New numbness or weakness     Please return if you have any other emergent concerns.  Additional Information:  Your vital signs today were: BP 118/75 mmHg   Pulse 74   Temp(Src) 99.1 F (37.3 C) (Oral)   Resp 18   Ht 5\' 11"  (1.803 m)   Wt 108.863 kg   BMI 33.49 kg/m2   SpO2 100% If your blood pressure (BP) was elevated above 135/85 this visit, please have this repeated by your doctor within one month. ---------------

## 2015-06-21 NOTE — ED Notes (Signed)
Pt decided not to stay.  Chart removed

## 2015-06-21 NOTE — ED Provider Notes (Signed)
CSN: [id number]     Arrival date & time 06/21/15  1034 History   First MD Initiated Contact with Patient 06/21/15 1129     Chief Complaint  Patient presents with  . Cough   (Consider location/radiation/quality/duration/timing/severity/associated sxs/prior Treatment) HPI 40 y.o. male presents to the Emergency Department today due to nasal congestion x 3 days. Associated productive cough. Came to ED due to concern for Pneumonia. No fevers. No rhinorrhea. No CP/SOB/ABD pain. No N/V/D. No sore throat. No other symptoms noted.   Past Medical History  Diagnosis Date  . Asthma    Past Surgical History  Procedure Laterality Date  . Hand surgery Right    No family history on file. Social History  Substance Use Topics  . Smoking status: Never Smoker   . Smokeless tobacco: None  . Alcohol Use: Yes    Review of Systems ROS reviewed and all are negative for acute change except as noted in the HPI.  Allergies  Shrimp  Home Medications   Prior to Admission medications   Medication Sig Start Date End Date Taking? Authorizing Provider  acetaminophen (TYLENOL) 500 MG tablet Take 1 tablet (500 mg total) by mouth every 6 (six) hours as needed. 11/09/14  Yes Domenic Moras, PA-C  albuterol (PROVENTIL HFA;VENTOLIN HFA) 108 (90 BASE) MCG/ACT inhaler Inhale 2 puffs into the lungs every 4 (four) hours as needed for wheezing or shortness of breath. 07/06/14  Yes Al Corpus, PA-C  promethazine (PHENERGAN) 25 MG tablet Take 1 tablet (25 mg total) by mouth every 6 (six) hours as needed for nausea. 11/09/14  Yes Domenic Moras, PA-C   BP 118/75 mmHg  Pulse 74  Temp(Src) 99.1 F (37.3 C) (Oral)  Resp 18  Ht 5\' 11"  (1.803 m)  Wt 108.863 kg  BMI 33.49 kg/m2  SpO2 100%  Physical Exam  Constitutional: He is oriented to person, place, and time. He appears well-developed and well-nourished.  HENT:  Head: Normocephalic and atraumatic.  Right Ear: Tympanic membrane, external ear and ear canal normal.  Left  Ear: Tympanic membrane, external ear and ear canal normal.  Nose: Right sinus exhibits maxillary sinus tenderness. Left sinus exhibits maxillary sinus tenderness.  Mouth/Throat: Uvula is midline, oropharynx is clear and moist and mucous membranes are normal.  Eyes: EOM are normal. Pupils are equal, round, and reactive to light.  Neck: Normal range of motion. Neck supple. No tracheal deviation present.  Cardiovascular: Normal rate, regular rhythm and normal heart sounds.   Pulmonary/Chest: Effort normal and breath sounds normal. No respiratory distress. He has no wheezes. He has no rales. He exhibits no tenderness.  Abdominal: Soft. There is no tenderness.  Musculoskeletal: Normal range of motion.  Neurological: He is alert and oriented to person, place, and time.  Skin: Skin is warm and dry.  Psychiatric: He has a normal mood and affect. His behavior is normal. Thought content normal.  Nursing note and vitals reviewed.  ED Course  Procedures (including critical care time) Labs Review Labs Reviewed - No data to display  Imaging Review Dg Chest 2 View  06/21/2015  CLINICAL DATA:  Congestion for 3 days EXAM: CHEST  2 VIEW COMPARISON:  July 06, 2014 FINDINGS: There is a small granuloma in the left upper lobe. There is no edema or consolidation. Heart size and pulmonary vascularity are normal. No adenopathy. No bone lesions. IMPRESSION: No edema or consolidation. Electronically Signed   By: Lowella Grip III M.D.   On: 06/21/2015 11:41   I have  personally reviewed and evaluated these images and lab results as part of my medical decision-making.   EKG Interpretation None      MDM  I have reviewed relevant imaging studies. I have reviewed the relevant previous healthcare records. I obtained HPI from historian.  ED Course:  Assessment: 37y M Pt resents with nasal congestion x 3 days. CXR negative for acute infiltrate. Patients symptoms are consistent with URI, likely viral etiology.  Discussed that antibiotics are not indicated for viral infections. Pt will be discharged with symptomatic treatment.  Verbalizes understanding and is agreeable with plan. Pt is hemodynamically stable & in NAD prior to dc.  Disposition/Plan:  DC Home Additional Verbal discharge instructions given and discussed with patient.  Pt Instructed to f/u with PCP in the next 48-72 hours for evaluation and treatment of symptoms. Return precautions given Pt acknowledges and agrees with plan  Supervising Physician Sherwood Gambler, MD   Final diagnoses:  Viral syndrome     Shary Decamp, PA-C 06/21/15 1353  Sherwood Gambler, MD 06/21/15 1535

## 2015-12-04 ENCOUNTER — Encounter (HOSPITAL_COMMUNITY): Payer: Self-pay | Admitting: Vascular Surgery

## 2015-12-04 ENCOUNTER — Emergency Department (HOSPITAL_COMMUNITY)
Admission: EM | Admit: 2015-12-04 | Discharge: 2015-12-04 | Disposition: A | Discharge status: Home / Self Care | Payer: Self-pay | Attending: Emergency Medicine | Admitting: Emergency Medicine

## 2015-12-04 DIAGNOSIS — L259 Unspecified contact dermatitis, unspecified cause: Secondary | ICD-10-CM | POA: Insufficient documentation

## 2015-12-04 DIAGNOSIS — Z79899 Other long term (current) drug therapy: Secondary | ICD-10-CM | POA: Insufficient documentation

## 2015-12-04 DIAGNOSIS — J45909 Unspecified asthma, uncomplicated: Secondary | ICD-10-CM | POA: Insufficient documentation

## 2015-12-04 MED ORDER — TRIAMCINOLONE ACETONIDE 0.5 % EX OINT: 1.0000 "application " | TOPICAL_OINTMENT | Freq: Two times a day (BID) | CUTANEOUS | 0 refills | Status: DC

## 2015-12-04 NOTE — ED Notes (Signed)
Declined W/C at D/C and was escorted to lobby by RN. 

## 2015-12-04 NOTE — ED Provider Notes (Signed)
[place] DEPT Provider Note   CSN: [id number] Arrival date & time: 12/04/15  1554  First Provider Contact:   First MD Initiated Contact with Patient 12/04/15 1826      By signing my name below, I, Evelene Croon, attest that this documentation has been prepared under the direction and in the presence of non-physician practitioner, Etta Quill, NP. Electronically Signed: Evelene Croon, Scribe. 12/04/2015. 6:33 PM.  History   Chief Complaint Chief Complaint  Patient presents with  . Rash    The history is provided by the patient. No language interpreter was used.  Rash   This is a new problem. The current episode started more than 2 days ago. The problem is associated with plant contact. There has been no fever. Associated symptoms include itching. He has tried anti-itch cream for the symptoms. The treatment provided mild relief.    HPI Comments:  Daniel Mcintosh is a 40 y.o. male who presents to the Emergency Department complaining of a pruritic rash to the right forearm x 4 days. 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 Pt notes additional patches to the left forearm and right lower chest. He denies SOB.    Past Medical History:  Diagnosis Date  . Asthma     There are no active problems to display for this patient.   Past Surgical History:  Procedure Laterality Date  . HAND SURGERY Right        Home Medications    Prior to Admission medications   Medication Sig Start Date End Date Taking? Authorizing Provider  acetaminophen (TYLENOL) 500 MG tablet Take 1 tablet (500 mg total) by mouth every 6 (six) hours as needed. 11/09/14   Domenic Moras, PA-C  albuterol (PROVENTIL HFA;VENTOLIN HFA) 108 (90 BASE) MCG/ACT inhaler Inhale 2 puffs into the lungs every 4 (four) hours as needed for wheezing or shortness of breath. 07/06/14   Al Corpus, PA-C  promethazine (PHENERGAN) 25 MG tablet Take 1 tablet (25 mg total) by mouth every 6 (six)  hours as needed for nausea. 11/09/14   Domenic Moras, PA-C    Family History History reviewed. No pertinent family history.  Social History Social History  Substance Use Topics  . Smoking status: Never Smoker  . Smokeless tobacco: Never Used  . Alcohol use Yes     Comment: 3 days per week     Allergies   Shrimp [shellfish allergy]   Review of Systems Review of Systems  Respiratory: Negative for shortness of breath.   Skin: Positive for itching and rash.   Physical Exam Updated Vital Signs BP 124/82 (BP Location: Left Arm)   Pulse 71   Temp 98.2 F (36.8 C) (Oral)   Resp 18   Ht 6\' 1"  (1.854 m)   Wt 245 lb (111.1 kg)   SpO2 97%   BMI 32.32 kg/m   Physical Exam  Constitutional: He is oriented to person, place, and time. He appears well-developed and well-nourished. No distress.  HENT:  Head: Normocephalic and atraumatic.  Eyes: Conjunctivae are normal.  Cardiovascular: Normal rate and regular rhythm.   Pulmonary/Chest: Effort normal and breath sounds normal. No respiratory distress.  Neurological: He is alert and oriented to person, place, and time.  Skin: Skin is warm and dry. Rash noted.  Vesicular rash on the lower aspect of both arms consistent with contact dermatitis   Psychiatric: He has a normal mood and affect.  Nursing note and vitals reviewed.  ED Treatments / Results  DIAGNOSTIC STUDIES:  Oxygen Saturation is 97% on RA, normal by my interpretation.    COORDINATION OF CARE:  6:29 PM Discussed treatment plan with pt at bedside and pt agreed to plan.  Labs (all labs ordered are listed, but only abnormal results are displayed) Labs Reviewed - No data to display  EKG  EKG Interpretation None       Radiology No results found.  Procedures Procedures   Medications Ordered in ED Medications - No data to display   Initial Impression / Assessment and Plan / ED Course  I have reviewed the triage vital signs and the nursing  notes.  Pertinent labs & imaging results that were available during my care of the patient were reviewed by me and considered in my medical decision making (see chart for details).  Clinical Course   Patient with contact dermatitis. Instructed to avoid offending agent and to use unscented soaps, lotions, and detergents. Will treat with topical steroid.  No signs of secondary infection. Follow up with PCP in 2-3 days. Return precautions discussed. Pt is safe for discharge at this time.   Final Clinical Impressions(s) / ED Diagnoses   Final diagnoses:  Contact dermatitis    New Prescriptions New Prescriptions   No medications on file   I personally performed the services described in this documentation, which was scribed in my presence. The recorded information has been reviewed and is accurate.    Etta Quill, NP 12/05/15 [id number]    Sherwood Gambler, MD 12/07/15 [phone]

## 2015-12-04 NOTE — ED Triage Notes (Signed)
Pt reports to the ED for eval of rash to bilateral arms and side. Rash is itchy. 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 Pt has tried OTC tx without relief. Pt A&Ox4, resp e/u, and skin warm and dry.

## 2016-08-12 ENCOUNTER — Emergency Department (HOSPITAL_COMMUNITY)
Admission: EM | Admit: 2016-08-12 | Discharge: 2016-08-12 | Disposition: A | Discharge status: Home / Self Care | Payer: Self-pay | Attending: Emergency Medicine | Admitting: Emergency Medicine

## 2016-08-12 ENCOUNTER — Encounter (HOSPITAL_COMMUNITY): Payer: Self-pay | Admitting: *Deleted

## 2016-08-12 ENCOUNTER — Emergency Department (HOSPITAL_COMMUNITY): Payer: Self-pay

## 2016-08-12 DIAGNOSIS — M79674 Pain in right toe(s): Secondary | ICD-10-CM | POA: Insufficient documentation

## 2016-08-12 DIAGNOSIS — Y9302 Activity, running: Secondary | ICD-10-CM | POA: Insufficient documentation

## 2016-08-12 DIAGNOSIS — Y999 Unspecified external cause status: Secondary | ICD-10-CM | POA: Insufficient documentation

## 2016-08-12 DIAGNOSIS — Y929 Unspecified place or not applicable: Secondary | ICD-10-CM | POA: Insufficient documentation

## 2016-08-12 DIAGNOSIS — J45909 Unspecified asthma, uncomplicated: Secondary | ICD-10-CM | POA: Insufficient documentation

## 2016-08-12 DIAGNOSIS — Z79899 Other long term (current) drug therapy: Secondary | ICD-10-CM | POA: Insufficient documentation

## 2016-08-12 DIAGNOSIS — X509XXA Other and unspecified overexertion or strenuous movements or postures, initial encounter: Secondary | ICD-10-CM | POA: Insufficient documentation

## 2016-08-12 MED ORDER — IBUPROFEN 800 MG PO TABS: 800.0000 mg | ORAL_TABLET | Freq: Three times a day (TID) | ORAL | 0 refills | Status: AC

## 2016-08-12 NOTE — ED Notes (Signed)
PT DISCHARGED. INSTRUCTIONS AND PRESCRIPTION GIVEN. AAOX4. PT IN NO APPARENT DISTRESS OR PAIN. THE OPPORTUNITY TO ASK QUESTIONS WAS PROVIDED. 

## 2016-08-12 NOTE — ED Triage Notes (Signed)
Pt was running today when he began having pain at the base of his right great toe. Pt has pain and pressure to the base of the right great toe, when he applies pressure he feels like it "pops out of place"

## 2016-08-12 NOTE — ED Provider Notes (Signed)
[place] DEPT Provider Note   CSN: [id number] Arrival date & time: 08/12/16  1830  By signing my name below, I, Margit Banda, attest that this documentation has been prepared under the direction and in the presence of [facility], PA-C.  Electronically Signed: Margit Banda, ED Scribe. 08/12/16. 8:22 PM.   History   Chief Complaint Chief Complaint  Patient presents with  . Toe Pain    HPI Daniel Mcintosh is a 41 y.o. male who presents to the Emergency Department complaining of severe, sudden sharp right big toe pain that started earlier today (08/12/16). Pt reports he was running and mis-stepped, when the pain started. Pt rates his pain a 7/10 severity. Applying pressure and movement exacerbates his discomfort and he notes it feels like it "pops out of place" when he places pressure on it. He is able to ambulate and bear weight. No treatments tried at home for relief. Denies falling and he reports no previous similar sx. Denies LOC. Pt denies numbness, tingling, weakness, back pain, neck pain or edema of his right ankle.    The history is provided by the patient. No language interpreter was used.    Past Medical History:  Diagnosis Date  . Asthma     There are no active problems to display for this patient.   Past Surgical History:  Procedure Laterality Date  . HAND SURGERY Right        Home Medications    Prior to Admission medications   Medication Sig Start Date End Date Taking? Authorizing Provider  acetaminophen (TYLENOL) 500 MG tablet Take 1 tablet (500 mg total) by mouth every 6 (six) hours as needed. 11/09/14   Domenic Moras, PA-C  albuterol (PROVENTIL HFA;VENTOLIN HFA) 108 (90 BASE) MCG/ACT inhaler Inhale 2 puffs into the lungs every 4 (four) hours as needed for wheezing or shortness of breath. 07/06/14   Al Corpus, PA-C  promethazine (PHENERGAN) 25 MG tablet Take 1 tablet (25 mg total) by mouth every 6 (six) hours as needed for nausea. 11/09/14   Domenic Moras, PA-C  triamcinolone ointment (KENALOG) 0.5 % Apply 1 application topically 2 (two) times daily. 12/04/15   Etta Quill, NP    Family History No family history on file.  Social History Social History  Substance Use Topics  . Smoking status: Never Smoker  . Smokeless tobacco: Never Used  . Alcohol use Yes     Comment: 3 days per week     Allergies   Shrimp [shellfish allergy]   Review of Systems Review of Systems  Musculoskeletal: Positive for arthralgias. Negative for back pain, joint swelling and neck pain.  Neurological: Negative for weakness and numbness.     Physical Exam Updated Vital Signs BP 133/79 (BP Location: Right Arm)   Pulse 97   Temp 98.2 F (36.8 C) (Oral)   Resp 18   SpO2 98%   Physical Exam  Constitutional: He is oriented to person, place, and time. He appears well-developed and well-nourished. No distress.  HENT:  Head: Normocephalic and atraumatic.  Eyes: Conjunctivae and EOM are normal. Pupils are equal, round, and reactive to light.  Neck: Normal range of motion. No JVD present. No tracheal deviation present.  Cardiovascular: Normal rate and intact distal pulses.   2+ dp/pt pulses bl  Pulmonary/Chest: Effort normal.  Abdominal: He exhibits no distension.  Musculoskeletal: Normal range of motion. He exhibits tenderness. He exhibits no edema or deformity.  Full ROM of right great big toe and ankle. 5/5  strength of ankle and toes. No EHL weakness. No deformity, crepitus, or ligamentous laxity. Pt maximally tender overlying the MTP of the great toe. No ecchymosis, swelling, erythema, or other signs of trauma noted.   Neurological: He is alert and oriented to person, place, and time. No sensory deficit.  Fluent speech, no facial droop, antalgic gait but pt able to bear weight on ankle. Gross sensation of BLE intact.  Skin: Capillary refill takes less than 2 seconds. No pallor.  Psychiatric: He has a normal mood and affect. His behavior is normal.   Nursing note and vitals reviewed.    ED Treatments / Results  DIAGNOSTIC STUDIES: Oxygen Saturation is 98% on RA, normal by my interpretation.   COORDINATION OF CARE: 8:19 PM-Discussed next steps with pt. Pt verbalized understanding and is agreeable with the plan.    Labs (all labs ordered are listed, but only abnormal results are displayed) Labs Reviewed - No data to display  EKG  EKG Interpretation None       Radiology Dg Toe Great Right  Result Date: 08/12/2016 CLINICAL DATA:  Initial encounter. 41 y/o male was running today and felt a pop in RIGHT great toe. Thinks it was dislocated, can feel a shifting of sorts when bearing full weight. Soreness and tightness around MTP joint of great toe. No prior injury. EXAM: RIGHT GREAT TOE COMPARISON:  None. FINDINGS: Osseous alignment is normal. No fracture line or displaced fracture fragment. Joint spaces are normally aligned. No degenerative change seen. Adjacent soft tissues are unremarkable. IMPRESSION: Negative.  No osseous fracture or dislocation. Electronically Signed   By: Franki Cabot M.D.   On: 08/12/2016 19:55    Procedures Procedures (including critical care time)  Medications Ordered in ED Medications - No data to display   Initial Impression / Assessment and Plan / ED Course  I have reviewed the triage vital signs and the nursing notes.  Pertinent labs & imaging results that were available during my care of the patient were reviewed by me and considered in my medical decision making (see chart for details).     Pt with right great toe pain after mis-stepping. Xrays negative. No fall or LOC. Do not suspect fracture, gout, septic arthritis, or osteomyelitis. Neurovascularly intact with good strength and capillary refill. Discussed RICE and tx with ibuprofen/tylenol for pain as needed. Given crutches in ED to help with ambulation at home. Recommended f/u with PCP in 1 week if symptoms do not improve. Discussed  strict ED return precautions. Pt verbalized understanding of and agreement with plan and is safe for discharge home at this time.   Final Clinical Impressions(s) / ED Diagnoses   Final diagnoses:  Pain of right great toe    New Prescriptions New Prescriptions   No medications on file  I personally performed the services described in this documentation, which was scribed in my presence. The recorded information has been reviewed and is accurate.     Renita Papa, PA-C 08/13/16 1214    Dorie Rank, MD 08/14/16 1345

## 2017-01-12 IMAGING — CR DG CHEST 2V
2 series · 2 of 2 positions shown · non-contrast
Comparison: July 06, 2014

CLINICAL DATA: Congestion for 3 days

EXAM:
CHEST  2 VIEW

[w chest pa]
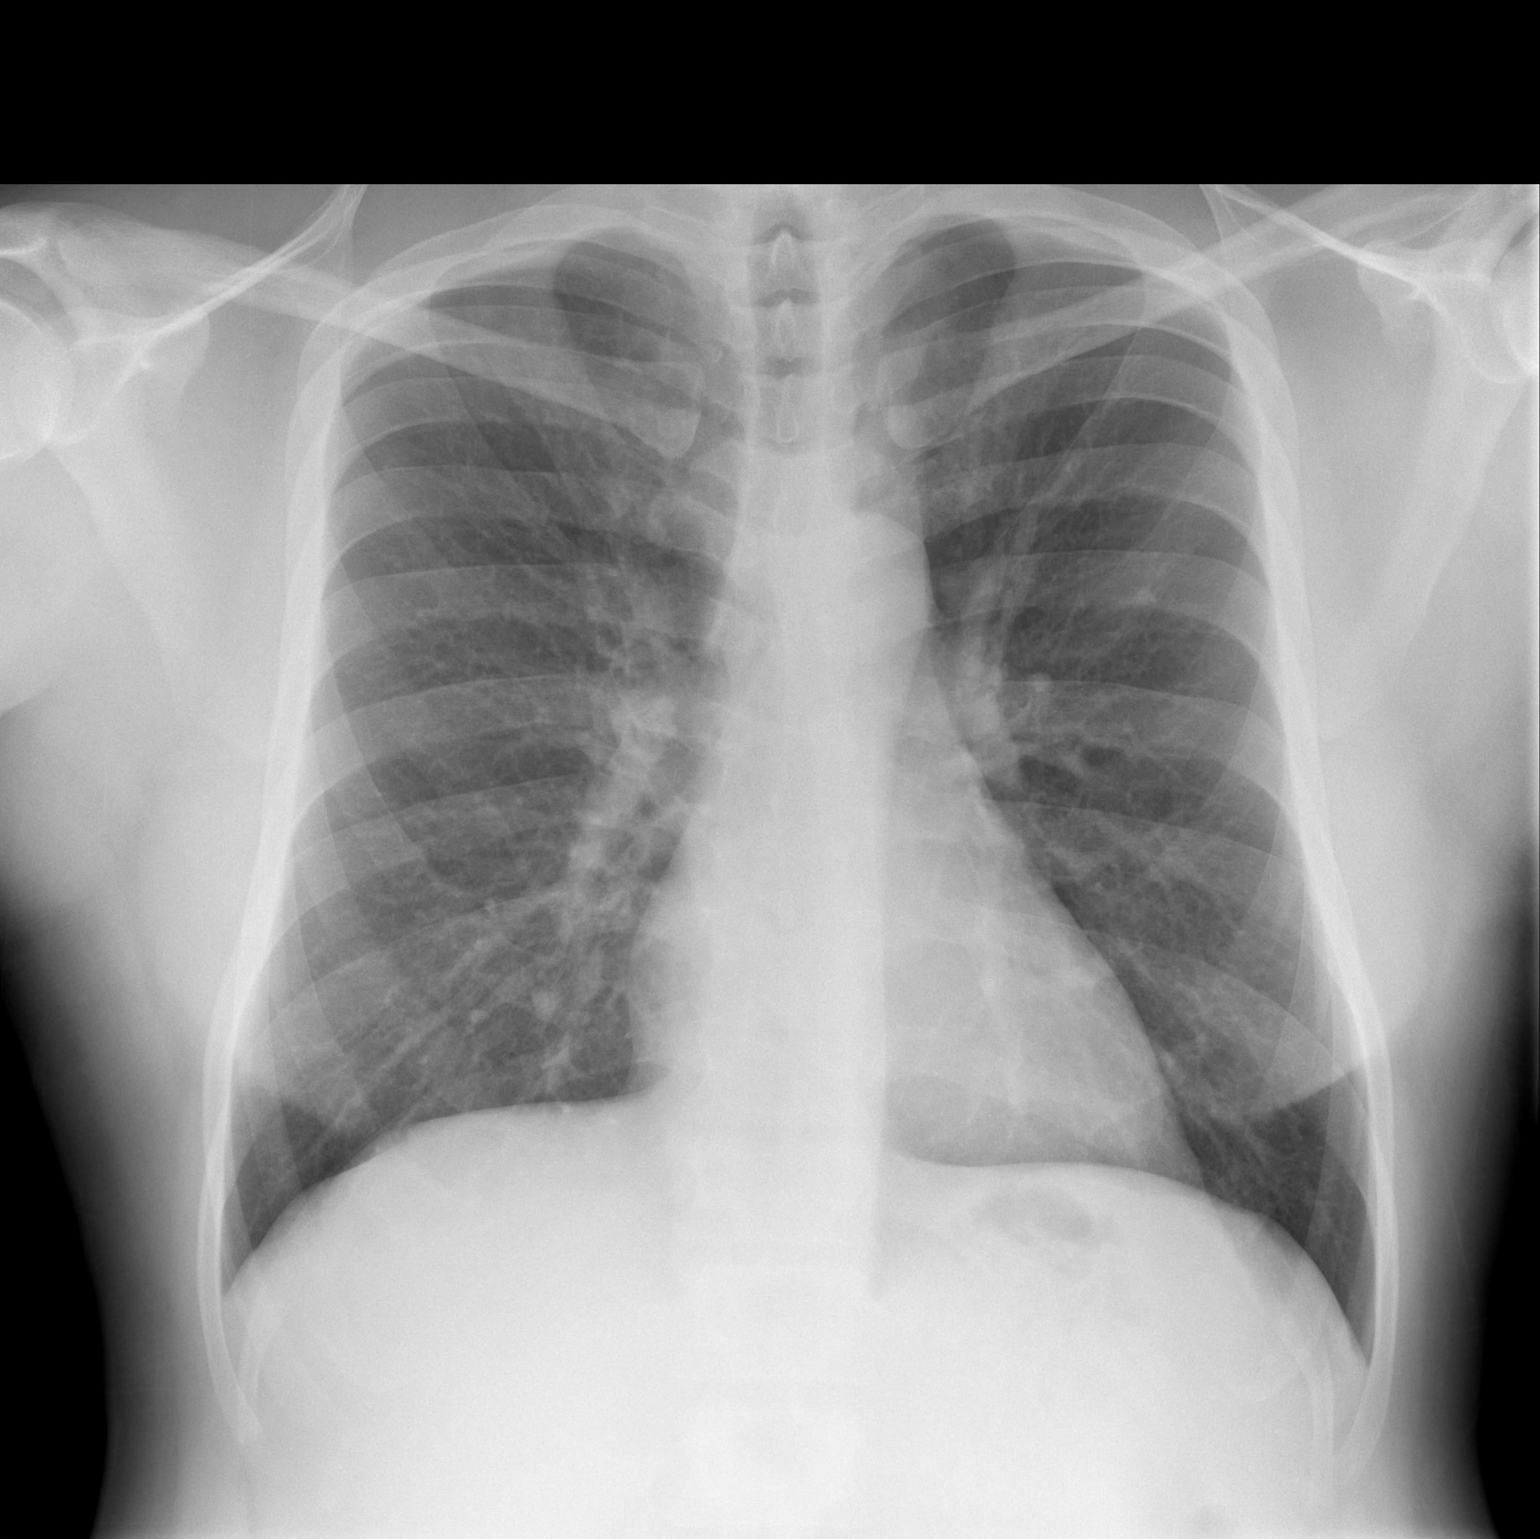

[w chest lat]
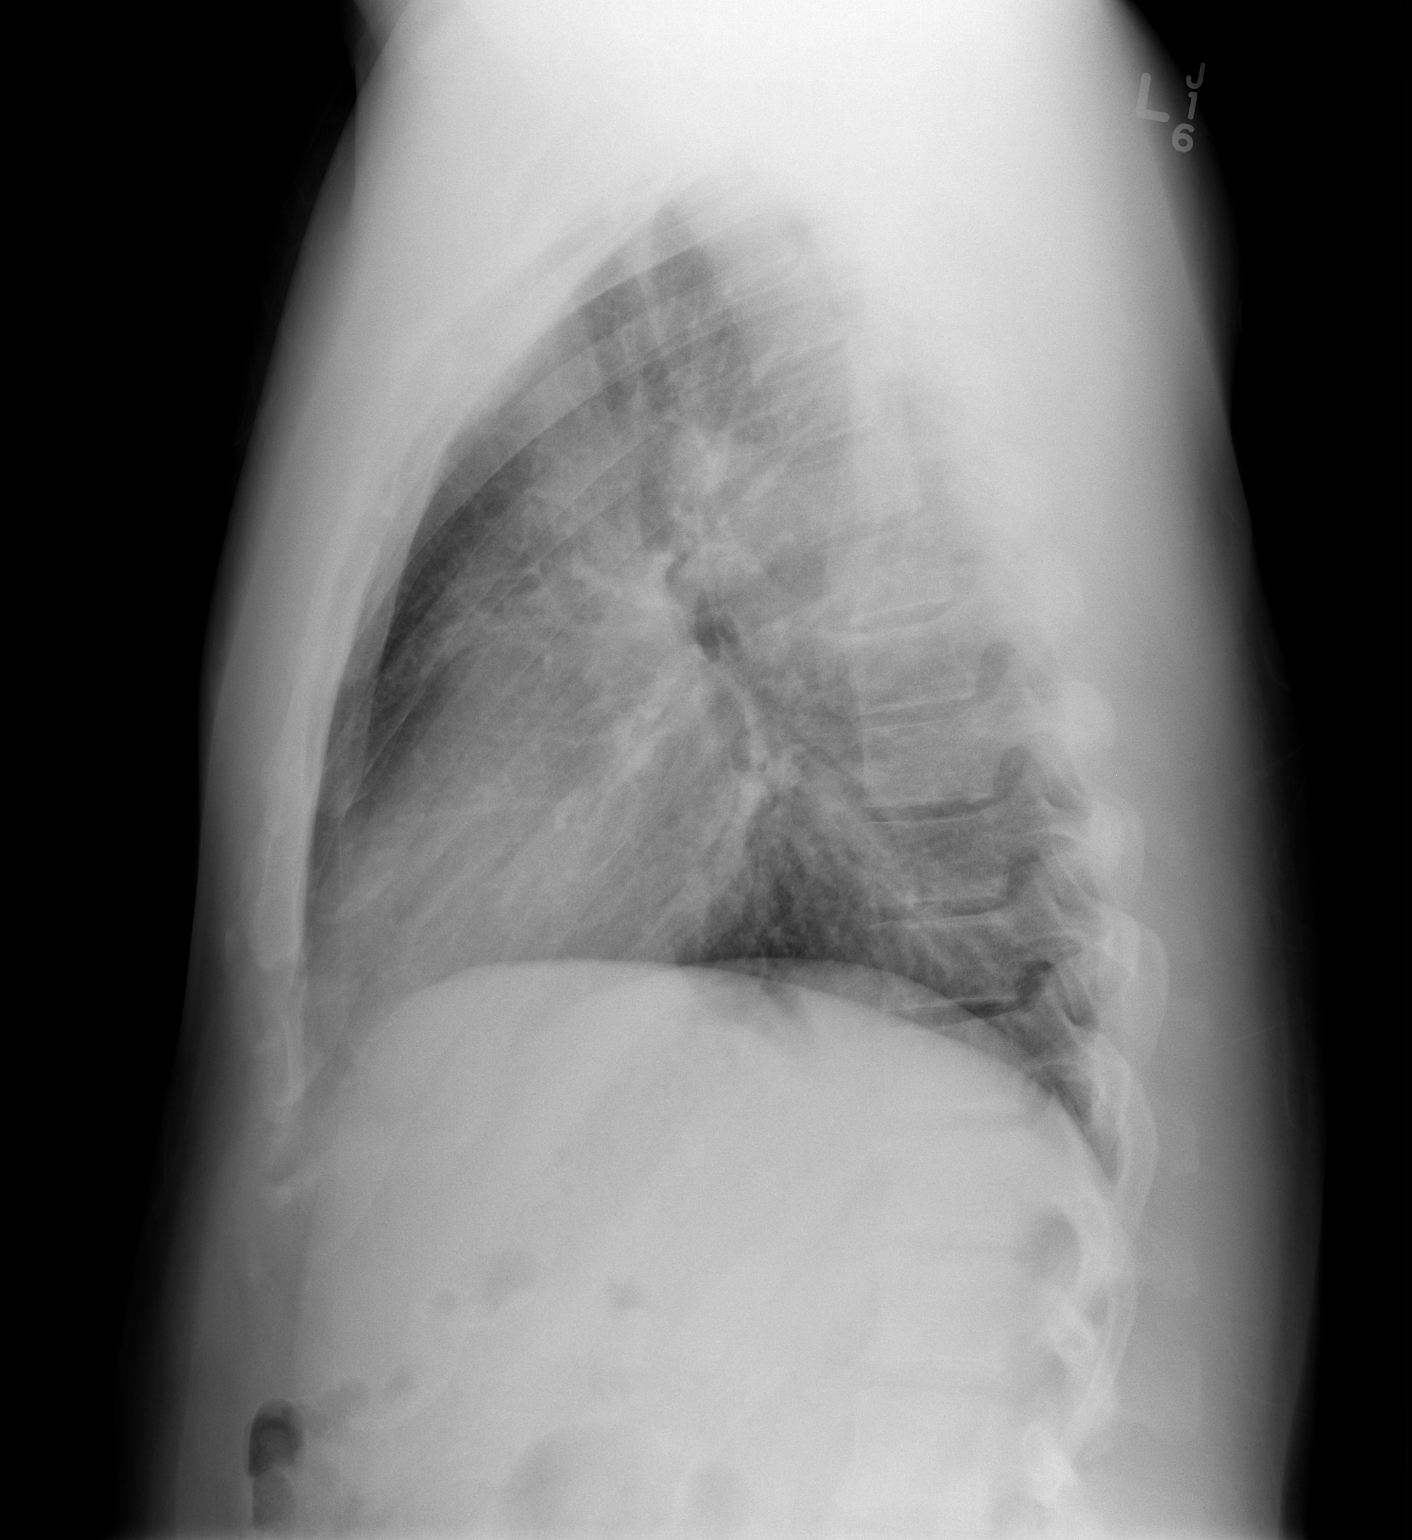

[2 of 2 positions shown; findings below may reference images not displayed]

FINDINGS: There is a small granuloma in the left upper lobe. There is no edema
or consolidation. Heart size and pulmonary vascularity are normal.
No adenopathy. No bone lesions.
IMPRESSION: No edema or consolidation.

## 2017-11-06 ENCOUNTER — Encounter (HOSPITAL_COMMUNITY): Payer: Self-pay

## 2017-11-06 ENCOUNTER — Emergency Department (HOSPITAL_COMMUNITY)
Admission: EM | Admit: 2017-11-06 | Discharge: 2017-11-06 | Disposition: A | Discharge status: Home / Self Care | Payer: Self-pay | Attending: Emergency Medicine | Admitting: Emergency Medicine

## 2017-11-06 DIAGNOSIS — J45909 Unspecified asthma, uncomplicated: Secondary | ICD-10-CM | POA: Insufficient documentation

## 2017-11-06 DIAGNOSIS — J029 Acute pharyngitis, unspecified: Secondary | ICD-10-CM | POA: Insufficient documentation

## 2017-11-06 DIAGNOSIS — Z79899 Other long term (current) drug therapy: Secondary | ICD-10-CM | POA: Insufficient documentation

## 2017-11-06 MED ORDER — FAMOTIDINE 20 MG PO TABS: 20.0000 mg | ORAL_TABLET | Freq: Two times a day (BID) | ORAL | 0 refills | Status: AC

## 2017-11-06 MED ORDER — ONDANSETRON 4 MG PO TBDP
4.0000 mg | ORAL_TABLET | Freq: Once | ORAL | Status: AC
  Administered 2017-11-06: 4 mg via ORAL
  Filled 2017-11-06: qty 1

## 2017-11-06 MED ORDER — GI COCKTAIL ~~LOC~~
30.0000 mL | Freq: Once | ORAL | Status: AC
  Administered 2017-11-06: 30 mL via ORAL
  Filled 2017-11-06: qty 30

## 2017-11-06 NOTE — ED Notes (Signed)
Pt tolerating PO without nausea or vomiting.

## 2017-11-06 NOTE — ED Provider Notes (Signed)
[place] [facility] EMERGENCY DEPARTMENT Provider Note   CSN: [id number] Arrival date & time: 11/06/17  1734     History   Chief Complaint Chief Complaint  Patient presents with  . throat scratchy    HPI Daniel Mcintosh is a 42 y.o. male presenting for scratchy throat that began approximately an hour ago after eating chicken and potatoes.  Patient states that after he had swallowed he felt that the food was caught in his throat, he states he then burped and felt a sour taste in the back of his throat followed by a scratchy feeling when he swallows. Patient reports a numb/chilling feeling that went throughout his body when the food was caught in his throat that passed within a few seconds. Patient denies neuro symptoms at this time, denies numbness, weakness or tingling.  Patient states that many of his family members have acid reflux but he denies a personal history of acid reflux.  Patient states he does not have any medical conditions and does not take any medications.  Patient states that his throat still feels scratchy at this time states that it is not painful, just annoying and he has not attempted anything to relieve his symptoms.  Patient states that he is still drinking well and without pain.   Patient denies nausea, vomiting, diarrhea, fever, chest pain, shortness of breath, trouble speaking, visual changes.  Patient sounds mildly congested in room, he states that this is normal for him this time of the year.  HPI  Past Medical History:  Diagnosis Date  . Asthma     There are no active problems to display for this patient.   Past Surgical History:  Procedure Laterality Date  . HAND SURGERY Right         Home Medications    Prior to Admission medications   Medication Sig Start Date End Date Taking? Authorizing Provider  acetaminophen (TYLENOL) 500 MG tablet Take 1 tablet (500 mg total) by mouth every 6 (six) hours as needed. 11/09/14  Yes Domenic Moras, PA-C  albuterol (PROVENTIL HFA;VENTOLIN HFA) 108 (90 BASE) MCG/ACT inhaler Inhale 2 puffs into the lungs every 4 (four) hours as needed for wheezing or shortness of breath. 07/06/14  Yes Daniel Nones, [place], PA-C  famotidine (PEPCID) 20 MG tablet Take 1 tablet (20 mg total) by mouth 2 (two) times daily. 11/06/17   Deliah Boston, PA-C  promethazine (PHENERGAN) 25 MG tablet Take 1 tablet (25 mg total) by mouth every 6 (six) hours as needed for nausea. Patient not taking: Reported on 11/06/2017 11/09/14   Domenic Moras, PA-C  triamcinolone ointment (KENALOG) 0.5 % Apply 1 application topically 2 (two) times daily. Patient not taking: Reported on 11/06/2017 12/04/15   Etta Quill, NP    Family History No family history on file.  Social History Social History   Tobacco Use  . Smoking status: Never Smoker  . Smokeless tobacco: Never Used  Substance Use Topics  . Alcohol use: Yes    Comment: 3 days per week  . Drug use: No     Allergies   Shrimp [shellfish allergy]   Review of Systems Review of Systems  Constitutional: Negative.  Negative for fever.  HENT: Positive for congestion, sore throat and trouble swallowing. Negative for drooling, facial swelling, postnasal drip, rhinorrhea, sinus pressure and voice change.   Eyes: Negative.  Negative for visual disturbance.  Respiratory: Negative.  Negative for choking, chest tightness, shortness of breath and wheezing.   Cardiovascular:  Negative.  Negative for chest pain.  Gastrointestinal: Negative.  Negative for abdominal pain, diarrhea, nausea and vomiting.  Genitourinary: Negative.  Negative for difficulty urinating and hematuria.  Musculoskeletal: Negative.  Negative for back pain, gait problem, neck pain and neck stiffness.  Skin: Negative.  Negative for rash and wound.  Neurological: Negative.  Negative for dizziness, syncope, facial asymmetry, speech difficulty, weakness, light-headedness and headaches.     Physical  Exam Updated Vital Signs BP 124/74 (BP Location: Left Arm)   Pulse 91   Temp 97.9 F (36.6 C) (Oral)   Resp 18   SpO2 99%   Physical Exam  Constitutional: He is oriented to person, place, and time. He appears well-developed and well-nourished. No distress.  HENT:  Head: Normocephalic and atraumatic.  Right Ear: External ear normal.  Left Ear: External ear normal.  Nose: Nose normal.  Mouth/Throat: Uvula is midline, oropharynx is clear and moist and mucous membranes are normal. No oral lesions. No trismus in the jaw. No uvula swelling. No oropharyngeal exudate, posterior oropharyngeal edema, posterior oropharyngeal erythema or tonsillar abscesses.  The patient has normal phonation and is in control of secretions. No stridor.  Midline uvula without edema. Soft palate rises symmetrically. No tonsillar erythema or exudates. No PTA. Tongue protrusion is normal. No trismus. No creptius on neck palpation and patient has good dentition. No gingival erythema or fluctuance noted. Mucus membranes moist.  No signs suggesting Ludwig's angina or retropharyngeal abscess.  Eyes: Pupils are equal, round, and reactive to light. Conjunctivae and EOM are normal.  Neck: Normal range of motion. Neck supple. No JVD present. No tracheal deviation present.  Cardiovascular: Normal rate and regular rhythm.  Pulmonary/Chest: Effort normal and breath sounds normal. No respiratory distress.  Abdominal: Soft. Bowel sounds are normal. There is no tenderness. There is no rebound and no guarding.  Musculoskeletal: Normal range of motion. He exhibits no tenderness or deformity.  Neurological: He is alert and oriented to person, place, and time. He has normal strength. No cranial nerve deficit or sensory deficit.  Skin: Skin is warm and dry. He is not diaphoretic.  Psychiatric: He has a normal mood and affect. His behavior is normal.     ED Treatments / Results  Labs (all labs ordered are listed, but only abnormal  results are displayed) Labs Reviewed - No data to display  EKG None  Radiology No results found.  Procedures Procedures (including critical care time)  Medications Ordered in ED Medications  gi cocktail (Maalox,Lidocaine,Donnatal) (30 mLs Oral Given 11/06/17 1828)  ondansetron (ZOFRAN-ODT) disintegrating tablet 4 mg (4 mg Oral Given 11/06/17 1941)     Initial Impression / Assessment and Plan / ED Course  I have reviewed the triage vital signs and the nursing notes.  Pertinent labs & imaging results that were available during my care of the patient were reviewed by me and considered in my medical decision making (see chart for details).  Clinical Course as of Nov 07 257  Thu Nov 06, 2017  1930 Patient reports nausea after GI cocktail, I have ordered Zofran.   [BM]  2046 Reevaluation patient states that he is feeling much improved, states that his scratchy throat feels better and is ready for discharge.   [BM]    Clinical Course User Index [BM] Deliah Boston, PA-C   Patient's symptoms controlled in the department with a GI cocktail and Zofran, patient states that his scratchy throat is feeling much better now and he is ready to be  discharged.  Patient has been drinking ginger ale and water in department without difficulty.  It is likely that the patient's symptoms are related to acid reflux, I have prescribed the patient Pepcid to treat his symptoms and have encouraged him to follow-up with his primary care provider.  I have given him referral to [facility] health community health and wellness to establish a primary care provider.  At this time there does not appear to be any evidence of an acute emergency medical condition and the patient appears stable for discharge with appropriate outpatient follow up. Diagnosis was discussed with patient who verbalizes understanding and is agreeable to discharge. I have discussed return precautions with patient and family who verbalize  understanding of return precautions. Patient strongly encouraged to follow-up with their PCP.  This note was dictated using DragonOne dictation software; please contact for any inconsistencies within the note.   Final Clinical Impressions(s) / ED Diagnoses   Final diagnoses:  Sore throat    ED Discharge Orders        Ordered    famotidine (PEPCID) 20 MG tablet  2 times daily     11/06/17 2051       Gari Crown 11/07/17 0303    Valarie Merino, MD 11/07/17 [phone]

## 2017-11-06 NOTE — ED Notes (Signed)
Patient ambulatory to bathroom with steady gait at this time 

## 2017-11-06 NOTE — ED Notes (Signed)
Patient verbalizes understanding of discharge instructions. Opportunity for questioning and answers were provided. Armband removed by staff, pt discharged from ED ambulatory.   

## 2017-11-06 NOTE — ED Triage Notes (Signed)
PT drinking a soda

## 2017-11-06 NOTE — Discharge Instructions (Addendum)
Please follow-up with your primary care provider regarding your visit today, if you do not have a primary care provider please call [place] community health and wellness to establish one. Please return to the emergency department for any new or worsening symptoms. It is possible that your symptoms of a scratchy/burning throat may be related to acid reflux, you may use Pepcid as prescribed.  Please follow-up with your primary care provider for further evaluation.  Contact a health care provider if: You have a fever for more than 2-3 days. You have symptoms that last (are persistent) for more than 2-3 days. Your throat does not get better within 7 days. You have a fever and your symptoms suddenly get worse. Get help right away if: You have difficulty breathing. You cannot swallow fluids, soft foods, or your saliva. You have increased swelling in your throat or neck. You have persistent nausea and vomiting.

## 2017-11-06 NOTE — ED Triage Notes (Addendum)
Patient complains of throat feeling scratchy and states congestion x 1 day. States that he had episode of general numbness while eating and now that has resolved. Alert and oriented, sounds congested and clearing throat throughout assessment. No neuro deficits, NAD. Goes on to stay I think I may have reflux

## 2018-02-18 ENCOUNTER — Other Ambulatory Visit: Payer: Self-pay

## 2018-02-18 ENCOUNTER — Emergency Department (HOSPITAL_COMMUNITY)
Admission: EM | Admit: 2018-02-18 | Discharge: 2018-02-18 | Disposition: A | Discharge status: Home / Self Care | Payer: Self-pay | Attending: Emergency Medicine | Admitting: Emergency Medicine

## 2018-02-18 ENCOUNTER — Encounter (HOSPITAL_COMMUNITY): Payer: Self-pay | Admitting: Emergency Medicine

## 2018-02-18 DIAGNOSIS — K047 Periapical abscess without sinus: Secondary | ICD-10-CM | POA: Insufficient documentation

## 2018-02-18 DIAGNOSIS — J45909 Unspecified asthma, uncomplicated: Secondary | ICD-10-CM | POA: Insufficient documentation

## 2018-02-18 DIAGNOSIS — Z79899 Other long term (current) drug therapy: Secondary | ICD-10-CM | POA: Insufficient documentation

## 2018-02-18 MED ORDER — AMOXICILLIN 500 MG PO CAPS: 500.0000 mg | ORAL_CAPSULE | Freq: Three times a day (TID) | ORAL | 0 refills | Status: DC

## 2018-02-18 NOTE — Discharge Instructions (Signed)
Take Amoxicillin as prescribed until finished. Use ibuprofen 600mg  every 6 hours for pain control. Follow up with your dentist.

## 2018-02-18 NOTE — ED Notes (Signed)
E-Signature not available, pt verbalized understanding of DC instructions and prescriptions.

## 2018-02-18 NOTE — ED Triage Notes (Signed)
Pt stated that he broke his tooth awhile back and an abscess grew over the broken tooth. Pt stated the abscess popped on Saturday. Pt unable to see dentist due to no insurance.

## 2018-02-18 NOTE — ED Provider Notes (Signed)
[facility]PART[facility] Provider Note   CSN: [id number] Arrival date & time: 02/18/18  0242     History   Chief Complaint Chief Complaint  Patient presents with  . Tooth Pain    HPI Daniel Mcintosh is a 42 y.o. male.   42 y/o male presents for dental abscess. Reports left upper dental pain for the past few days. States that abscess began draining 4 days ago which improved discomfort. Noticed some worsening swelling and increasing drainage today. Wanted to be evaluated "so it didn't poison my system". No fevers, pain with jaw movement. Has appointment with his dentist tomorrow.     Past Medical History:  Diagnosis Date  . Asthma     There are no active problems to display for this patient.   Past Surgical History:  Procedure Laterality Date  . HAND SURGERY Right         Home Medications    Prior to Admission medications   Medication Sig Start Date End Date Taking? Authorizing Provider  acetaminophen (TYLENOL) 500 MG tablet Take 1 tablet (500 mg total) by mouth every 6 (six) hours as needed. 11/09/14   Domenic Moras, PA-C  albuterol (PROVENTIL HFA;VENTOLIN HFA) 108 (90 BASE) MCG/ACT inhaler Inhale 2 puffs into the lungs every 4 (four) hours as needed for wheezing or shortness of breath. 07/06/14   Al Corpus, PA-C  amoxicillin (AMOXIL) 500 MG capsule Take 1 capsule (500 mg total) by mouth 3 (three) times daily. 02/18/18   Antonietta Breach, PA-C  famotidine (PEPCID) 20 MG tablet Take 1 tablet (20 mg total) by mouth 2 (two) times daily. 11/06/17   Deliah Boston, PA-C  promethazine (PHENERGAN) 25 MG tablet Take 1 tablet (25 mg total) by mouth every 6 (six) hours as needed for nausea. Patient not taking: Reported on 11/06/2017 11/09/14   Domenic Moras, PA-C  triamcinolone ointment (KENALOG) 0.5 % Apply 1 application topically 2 (two) times daily. Patient not taking: Reported on 11/06/2017 12/04/15   Etta Quill, NP    Family History History  reviewed. No pertinent family history.  Social History Social History   Tobacco Use  . Smoking status: Never Smoker  . Smokeless tobacco: Never Used  Substance Use Topics  . Alcohol use: Yes    Comment: 3 days per week  . Drug use: No     Allergies   Shrimp [shellfish allergy]   Review of Systems Review of Systems Ten systems reviewed and are negative for acute change, except as noted in the HPI.    Physical Exam Updated Vital Signs BP 131/84 (BP Location: Right Arm)   Pulse 73   Temp 98.5 F (36.9 C) (Oral)   Resp 16   Ht 6\' 1"  (1.854 m)   Wt 111.1 kg   SpO2 100%   BMI 32.32 kg/m   Physical Exam  Constitutional: He is oriented to person, place, and time. He appears well-developed and well-nourished. No distress.  Nontoxic appearing and in NAD  HENT:  Head: Normocephalic and atraumatic.  No trismus. Tolerating secretions. Gingival swelling above left upper 2nd premolar. No active drainage or fluctuance.  Eyes: Conjunctivae and EOM are normal. No scleral icterus.  Neck: Normal range of motion.  Pulmonary/Chest: Effort normal. No respiratory distress.  Respirations even and unlabored  Musculoskeletal: Normal range of motion.  Neurological: He is alert and oriented to person, place, and time. He exhibits normal muscle tone. Coordination normal.  Skin: Skin is warm and dry. No rash  noted. He is not diaphoretic. No erythema. No pallor.  Psychiatric: He has a normal mood and affect. His behavior is normal.  Nursing note and vitals reviewed.    ED Treatments / Results  Labs (all labs ordered are listed, but only abnormal results are displayed) Labs Reviewed - No data to display  EKG None  Radiology No results found.  Procedures Procedures (including critical care time)  Medications Ordered in ED Medications - No data to display   Initial Impression / Assessment and Plan / ED Course  I have reviewed the triage vital signs and the nursing  notes.  Pertinent labs & imaging results that were available during my care of the patient were reviewed by me and considered in my medical decision making (see chart for details).     Patient with toothache.  Has dental abscess on exam which is draining.  Exam unconcerning for Ludwig's angina or spread of infection.  Will treat with Amoxicillin and pain medicine.  Urged patient to follow-up with dentist as scheduled.  Return precautions discussed and provided. Patient discharged in stable condition with no unaddressed concerns.  Vitals:   02/18/18 0252 02/18/18 0254  BP:  131/84  Pulse:  73  Resp:  16  Temp:  98.5 F (36.9 C)  TempSrc:  Oral  SpO2:  100%  Weight: 111.1 kg   Height: 6\' 1"  (1.854 m)     Final Clinical Impressions(s) / ED Diagnoses   Final diagnoses:  Dental abscess    ED Discharge Orders         Ordered    amoxicillin (AMOXIL) 500 MG capsule  3 times daily     02/18/18 0316           Antonietta Breach, PA-C 02/18/18 0325    Ward, Delice Bison, DO 02/18/18 [phone]

## 2018-02-21 ENCOUNTER — Emergency Department (HOSPITAL_COMMUNITY)
Admission: EM | Admit: 2018-02-21 | Discharge: 2018-02-21 | Disposition: A | Discharge status: Home / Self Care | Payer: Self-pay | Attending: Emergency Medicine | Admitting: Emergency Medicine

## 2018-02-21 ENCOUNTER — Emergency Department (HOSPITAL_COMMUNITY): Payer: Self-pay

## 2018-02-21 ENCOUNTER — Encounter (HOSPITAL_COMMUNITY): Payer: Self-pay | Admitting: Emergency Medicine

## 2018-02-21 ENCOUNTER — Other Ambulatory Visit: Payer: Self-pay

## 2018-02-21 DIAGNOSIS — J45909 Unspecified asthma, uncomplicated: Secondary | ICD-10-CM | POA: Insufficient documentation

## 2018-02-21 DIAGNOSIS — Z79899 Other long term (current) drug therapy: Secondary | ICD-10-CM | POA: Insufficient documentation

## 2018-02-21 DIAGNOSIS — M25561 Pain in right knee: Secondary | ICD-10-CM | POA: Insufficient documentation

## 2018-02-21 NOTE — ED Notes (Signed)
Patient transported to X-ray 

## 2018-02-21 NOTE — Discharge Instructions (Signed)
You were evaluated today for knee pain.  The x-rays did not show any fracture dislocation, however they did see some mild soft tissue swelling.  This is most likely from your fall that he took approximately 2 weeks ago.  I would recommend continued ice, elevation and anti-inflammatories.  If you are still having issues at follow-up with orthopedics.  I have referred you to Dr. Ninfa Linden, and orthopedist for reevaluation.  Return to the ED for new or worsening symptoms

## 2018-02-21 NOTE — ED Triage Notes (Signed)
Pt. Stated, Ive had rt. Knee pain for 2 weeks.

## 2018-02-21 NOTE — ED Notes (Signed)
ED Provider at bedside. 

## 2018-02-21 NOTE — ED Provider Notes (Signed)
[facility] EMERGENCY DEPARTMENT Provider Note   CSN: [id number] Arrival date & time: 02/21/18  0076     History   Chief Complaint Chief Complaint  Patient presents with  . Knee Pain    HPI Daniel Mcintosh is a 42 y.o. male with significant medical history who presents for evaluation of right knee pain.  Patient states he fell on his knee approximately 2 weeks ago and has had intermittent swelling and pain.  Patient states that when he does not stand on his leg all day he has no swelling or pain.  No LOC or head trauma with fall.  States his swelling and pain occur after he has been standing on his feet all day at work.  Has not tried anything for the pain or swelling.  Rates his pain a 2/10.  Denies erythema or warmth to knee.  Patient states he was seen in the emergency department approximately 2 days ago for dental pain and knee pain, however his knee pain was not addressed at that time.  Denies current dental pain at this time.   History obtained from patient.  No interpreter was used.  HPI  Past Medical History:  Diagnosis Date  . Asthma     There are no active problems to display for this patient.   Past Surgical History:  Procedure Laterality Date  . HAND SURGERY Right         Home Medications    Prior to Admission medications   Medication Sig Start Date End Date Taking? Authorizing Provider  acetaminophen (TYLENOL) 500 MG tablet Take 1 tablet (500 mg total) by mouth every 6 (six) hours as needed. 11/09/14   Domenic Moras, PA-C  albuterol (PROVENTIL HFA;VENTOLIN HFA) 108 (90 BASE) MCG/ACT inhaler Inhale 2 puffs into the lungs every 4 (four) hours as needed for wheezing or shortness of breath. 07/06/14   Al Corpus, PA-C  amoxicillin (AMOXIL) 500 MG capsule Take 1 capsule (500 mg total) by mouth 3 (three) times daily. 02/18/18   Antonietta Breach, PA-C  famotidine (PEPCID) 20 MG tablet Take 1 tablet (20 mg total) by mouth 2 (two) times daily.  11/06/17   Deliah Boston, PA-C  promethazine (PHENERGAN) 25 MG tablet Take 1 tablet (25 mg total) by mouth every 6 (six) hours as needed for nausea. Patient not taking: Reported on 11/06/2017 11/09/14   Domenic Moras, PA-C  triamcinolone ointment (KENALOG) 0.5 % Apply 1 application topically 2 (two) times daily. Patient not taking: Reported on 11/06/2017 12/04/15   Etta Quill, NP    Family History No family history on file.  Social History Social History   Tobacco Use  . Smoking status: Never Smoker  . Smokeless tobacco: Never Used  Substance Use Topics  . Alcohol use: Yes    Comment: 3 days per week  . Drug use: No     Allergies   Shrimp [shellfish allergy]   Review of Systems Review of Systems  Respiratory: Negative.   Cardiovascular: Negative.   Gastrointestinal: Negative.   Musculoskeletal: Negative for back pain and neck pain.       Right knee swelling.  Skin: Negative.   Neurological: Negative for dizziness, weakness, light-headedness and numbness.  All other systems reviewed and are negative.    Physical Exam Updated Vital Signs BP 134/84   Pulse 70   Temp 97.9 F (36.6 C)   Resp 18   SpO2 97%   Physical Exam  Constitutional: He appears well-developed and well-nourished.  No distress.  HENT:  Head: Atraumatic.  Eyes: Pupils are equal, round, and reactive to light.  Neck: Normal range of motion. Neck supple.  Cardiovascular: Normal rate and regular rhythm.  Pulmonary/Chest: Effort normal. No respiratory distress.  Abdominal: Soft. He exhibits no distension.  Musculoskeletal: Normal range of motion.  No tenderness to palpation of knee.  Able to flex and extend without difficulty.  Able to ambulate without difficulty.  5/5 strength bilateral lower extremity.  Negative varus and valgus stress right lower extremity.  No tenderness to tibia or fibula.  Negative anterior drawer test.  No calf pain or calf swelling.  Neurological: He is alert.  Intact  sensation to sharp and dull bilateral lower extremity.  Skin: Skin is warm and dry. He is not diaphoretic.  Mild patellar edema.  No erythema, warmth or ecchymosis to the knee.  No evidence of abrasions or lacerations to bilateral lower extremity  Psychiatric: He has a normal mood and affect.  Nursing note and vitals reviewed.    ED Treatments / Results  Labs (all labs ordered are listed, but only abnormal results are displayed) Labs Reviewed - No data to display  EKG None  Radiology Dg Knee Complete 4 Views Right  Result Date: 02/21/2018 CLINICAL DATA:  Right knee injury. EXAM: RIGHT KNEE - COMPLETE 4+ VIEW COMPARISON:  None. FINDINGS: No evidence of fracture, dislocation, or joint effusion. There is soft tissue swelling anterior to the patella. No significant arthropathy. Soft tissues are unremarkable. IMPRESSION: Prepatellar soft tissue swelling.  No acute fracture identified. Electronically Signed   By: Aletta Edouard M.D.   On: 02/21/2018 10:24    Procedures Procedures (including critical care time)  Medications Ordered in ED Medications - No data to display   Initial Impression / Assessment and Plan / ED Course  I have reviewed the triage vital signs and the nursing notes.  Pertinent labs & imaging results that were available during my care of the patient were reviewed by me and considered in my medical decision making (see chart for details).  42 year old male who appears otherwise well presents for evaluation of intermittent leg swelling and pain after fall approximately 2 weeks ago.  Afebrile, nonseptic, non-ill-appearing.  No apparent distress.  No tenderness to palpation right lower extremity.  Negative varus and valgus stress. Mild pre-patellar tissue swelling.  No erythema, warmth or ecchymosis to right lower extremity.  Low suspicion for septic joint.  Right lower extremity compartments soft.  Plain film negative for fracture dislocation, there is some prepatellar  soft tissue swelling., neurovascularly intact.  Low suspicion for emergent pathology.  Discussed symptomatic management.  Discussed follow-up with orthopedics if he has continued pain and swelling.  Discussed reasons to return to the emergency department.  Patient voiced understanding and is agreeable for follow-up    Final Clinical Impressions(s) / ED Diagnoses   Final diagnoses:  Acute pain of right knee    ED Discharge Orders    None       Sama Arauz A, PA-C 02/21/18 1047    Noemi Chapel, MD 02/23/18 1220

## 2019-06-30 ENCOUNTER — Other Ambulatory Visit: Payer: Self-pay | Admitting: Physician Assistant

## 2019-08-20 ENCOUNTER — Other Ambulatory Visit (HOSPITAL_COMMUNITY): Payer: Self-pay | Admitting: Physician Assistant

## 2019-08-20 ENCOUNTER — Other Ambulatory Visit: Payer: Self-pay | Admitting: Physician Assistant

## 2019-08-20 DIAGNOSIS — R2231 Localized swelling, mass and lump, right upper limb: Secondary | ICD-10-CM

## 2019-09-04 ENCOUNTER — Other Ambulatory Visit: Payer: Self-pay

## 2019-09-04 ENCOUNTER — Ambulatory Visit (HOSPITAL_COMMUNITY)
Admission: RE | Admit: 2019-09-04 | Discharge: 2019-09-04 | Disposition: A | Discharge status: Home / Self Care | Payer: 59 | Source: Ambulatory Visit | Attending: Physician Assistant | Admitting: Physician Assistant

## 2019-09-04 DIAGNOSIS — R2231 Localized swelling, mass and lump, right upper limb: Secondary | ICD-10-CM | POA: Insufficient documentation

## 2019-09-15 IMAGING — CR DG KNEE COMPLETE 4+V*R*
4 series · 4 of 4 positions shown · non-contrast
Comparison: None.

CLINICAL DATA: Right knee injury.

EXAM:
RIGHT KNEE - COMPLETE 4+ VIEW

[knee ap]
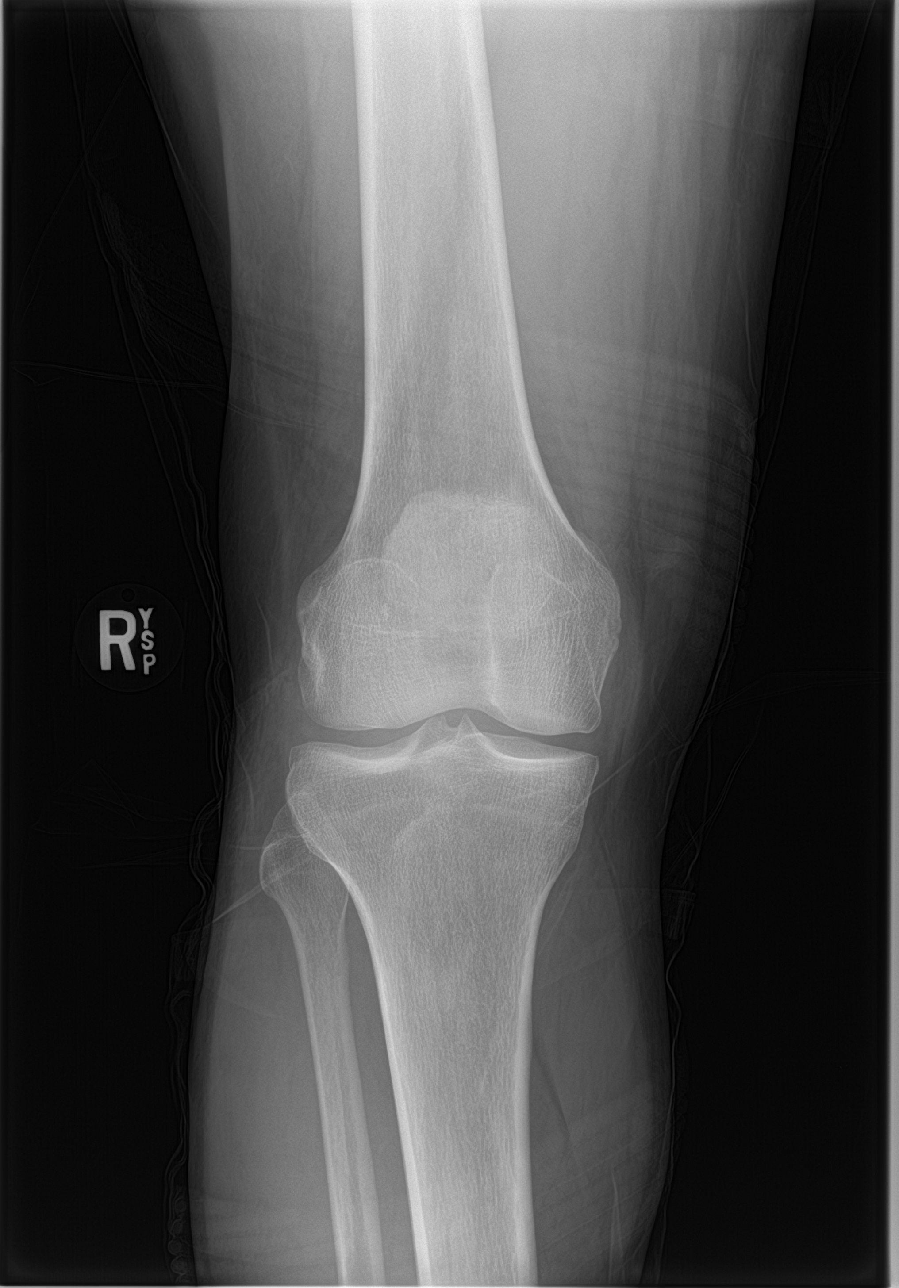

[knee lat]
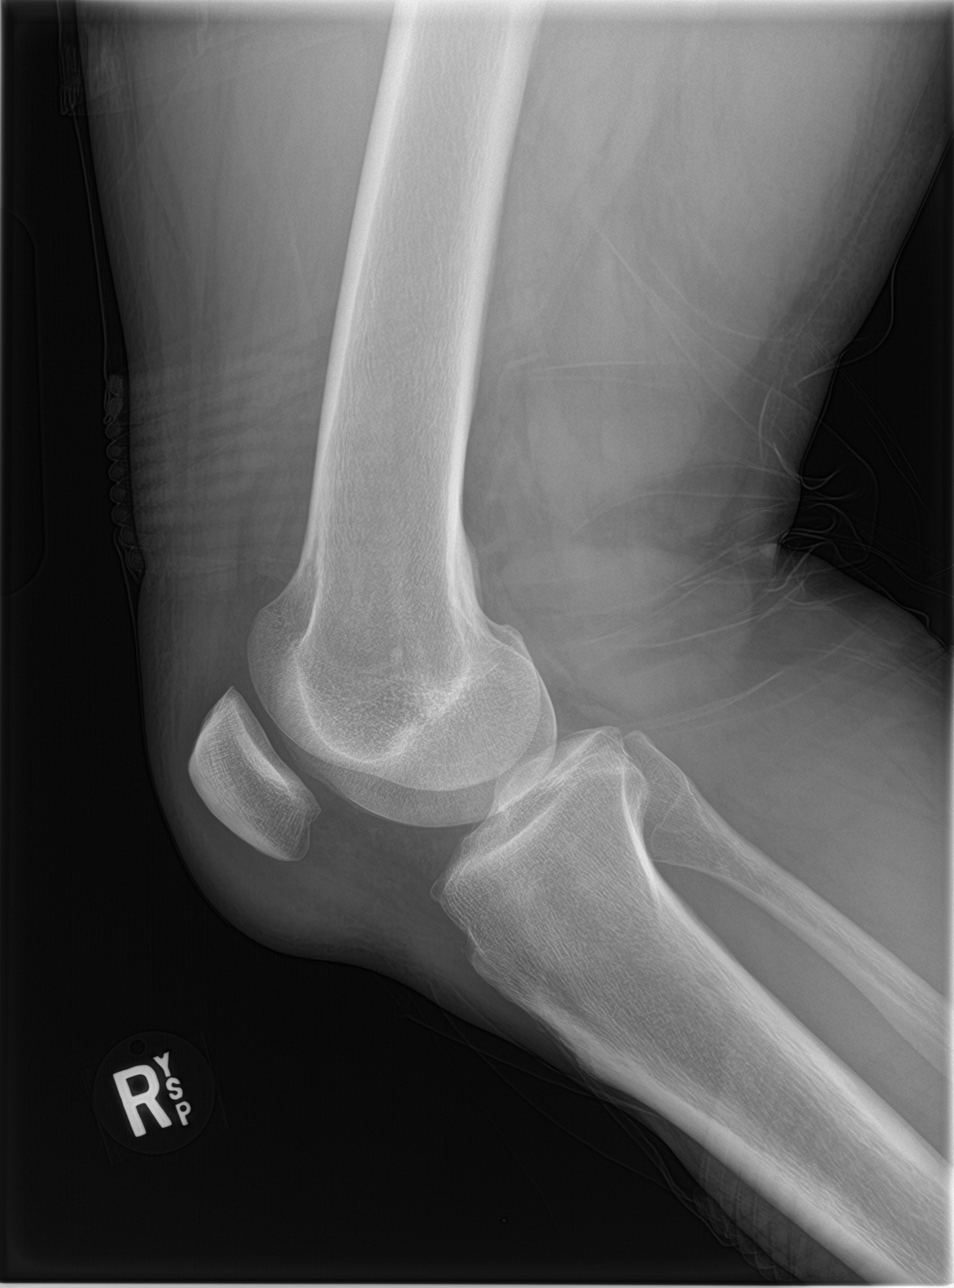

[knee obl (1 of 2)]
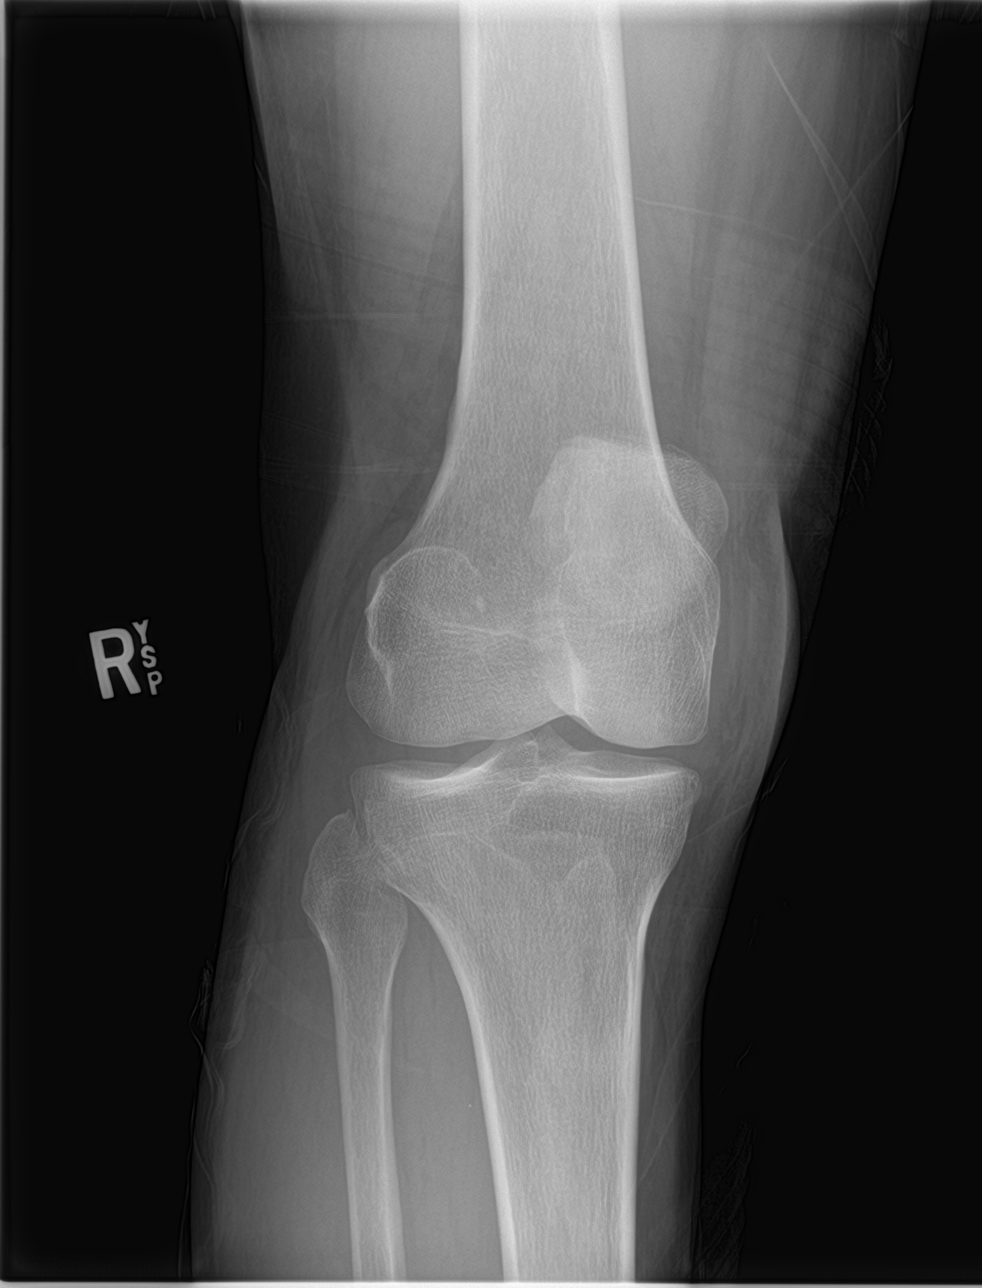

[knee obl (2 of 2)]
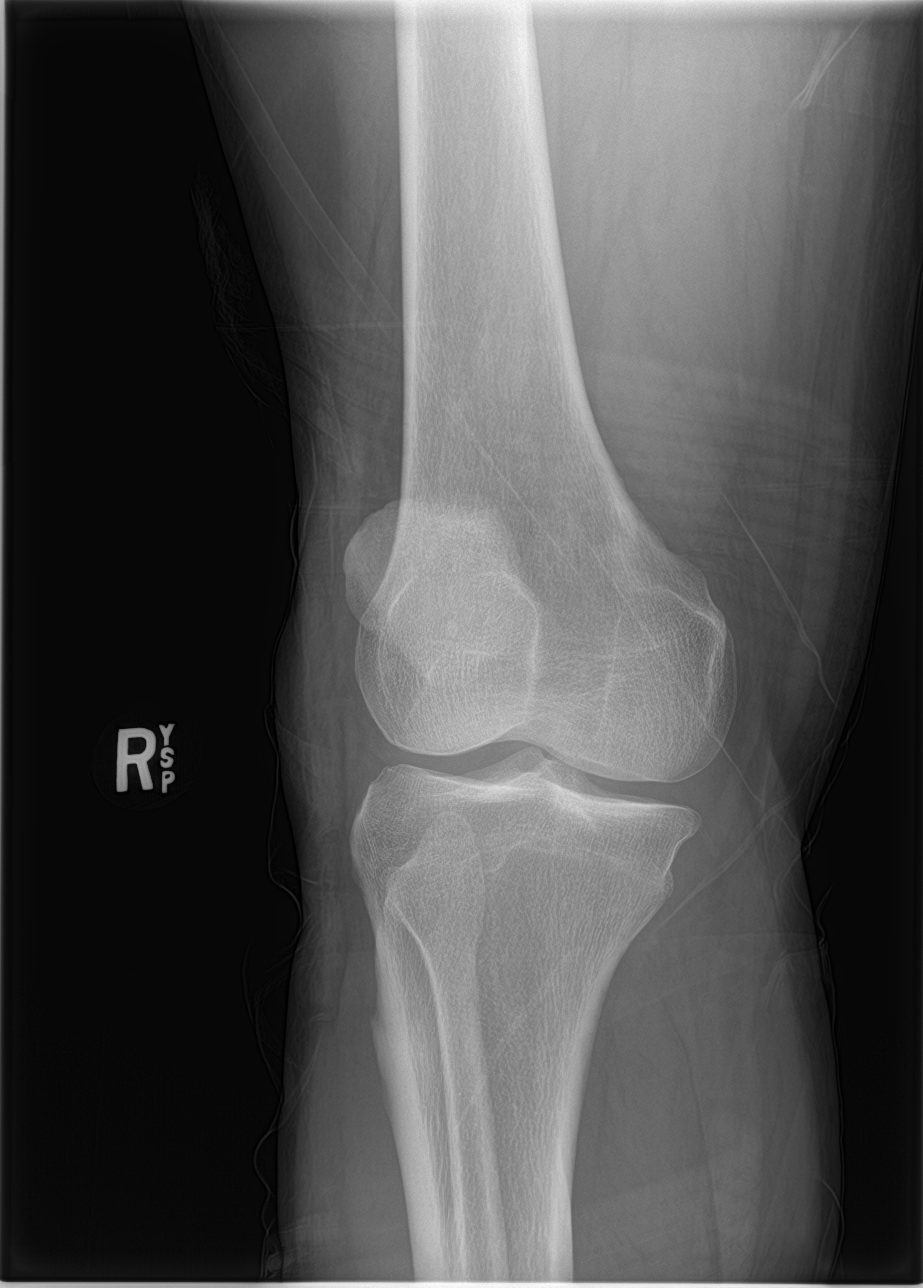

[4 of 4 positions shown; findings below may reference images not displayed]

FINDINGS: No evidence of fracture, dislocation, or joint effusion. There is
soft tissue swelling anterior to the patella. No significant
arthropathy. Soft tissues are unremarkable.
IMPRESSION: Prepatellar soft tissue swelling.  No acute fracture identified.

## 2019-09-24 ENCOUNTER — Other Ambulatory Visit: Payer: Self-pay

## 2019-09-24 ENCOUNTER — Encounter (HOSPITAL_BASED_OUTPATIENT_CLINIC_OR_DEPARTMENT_OTHER): Payer: Self-pay | Admitting: Orthopaedic Surgery

## 2019-09-28 ENCOUNTER — Other Ambulatory Visit (HOSPITAL_COMMUNITY)
Admission: RE | Admit: 2019-09-28 | Discharge: 2019-09-28 | Disposition: A | Discharge status: Home / Self Care | Payer: 59 | Source: Ambulatory Visit | Attending: Orthopaedic Surgery | Admitting: Orthopaedic Surgery

## 2019-09-28 DIAGNOSIS — Z20822 Contact with and (suspected) exposure to covid-19: Secondary | ICD-10-CM | POA: Insufficient documentation

## 2019-09-28 DIAGNOSIS — Z01812 Encounter for preprocedural laboratory examination: Secondary | ICD-10-CM | POA: Diagnosis present

## 2019-09-28 LAB — SARS CORONAVIRUS 2 (TAT 6-24 HRS): SARS Coronavirus 2: NEGATIVE

## 2019-09-30 ENCOUNTER — Encounter (HOSPITAL_BASED_OUTPATIENT_CLINIC_OR_DEPARTMENT_OTHER): Payer: Self-pay | Admitting: Orthopaedic Surgery

## 2019-09-30 ENCOUNTER — Other Ambulatory Visit: Payer: Self-pay

## 2019-09-30 ENCOUNTER — Ambulatory Visit (HOSPITAL_BASED_OUTPATIENT_CLINIC_OR_DEPARTMENT_OTHER): Payer: 59 | Admitting: Anesthesiology

## 2019-09-30 ENCOUNTER — Encounter (HOSPITAL_BASED_OUTPATIENT_CLINIC_OR_DEPARTMENT_OTHER)
Admission: RE | Disposition: A | Discharge status: Home / Self Care | Payer: Self-pay | Source: Home / Self Care | Attending: Orthopaedic Surgery

## 2019-09-30 ENCOUNTER — Ambulatory Visit (HOSPITAL_BASED_OUTPATIENT_CLINIC_OR_DEPARTMENT_OTHER)
Admission: RE | Admit: 2019-09-30 | Discharge: 2019-09-30 | Disposition: A | Discharge status: Home / Self Care | Payer: 59 | Attending: Orthopaedic Surgery | Admitting: Orthopaedic Surgery

## 2019-09-30 DIAGNOSIS — J45909 Unspecified asthma, uncomplicated: Secondary | ICD-10-CM | POA: Insufficient documentation

## 2019-09-30 DIAGNOSIS — D179 Benign lipomatous neoplasm, unspecified: Secondary | ICD-10-CM | POA: Insufficient documentation

## 2019-09-30 DIAGNOSIS — Z79899 Other long term (current) drug therapy: Secondary | ICD-10-CM | POA: Insufficient documentation

## 2019-09-30 HISTORY — PX: MASS EXCISION: SHX2000

## 2019-09-30 SURGERY — EXCISION MASS
Anesthesia: Monitor Anesthesia Care | Site: Hand | Laterality: Right

## 2019-09-30 MED ORDER — ONDANSETRON HCL 4 MG/2ML IJ SOLN
INTRAMUSCULAR | Status: AC
  Filled 2019-09-30: qty 2

## 2019-09-30 MED ORDER — MIDAZOLAM HCL 2 MG/2ML IJ SOLN
INTRAMUSCULAR | Status: AC
  Filled 2019-09-30: qty 2

## 2019-09-30 MED ORDER — FENTANYL CITRATE (PF) 100 MCG/2ML IJ SOLN
INTRAMUSCULAR | Status: AC
  Filled 2019-09-30: qty 2

## 2019-09-30 MED ORDER — FENTANYL CITRATE (PF) 100 MCG/2ML IJ SOLN
50.0000 ug | Freq: Once | INTRAMUSCULAR | Status: AC
  Administered 2019-09-30: 100 ug via INTRAVENOUS

## 2019-09-30 MED ORDER — MIDAZOLAM HCL 2 MG/2ML IJ SOLN
0.5000 mg | Freq: Once | INTRAMUSCULAR | Status: AC
  Administered 2019-09-30: 2 mg via INTRAVENOUS

## 2019-09-30 MED ORDER — ONDANSETRON HCL 4 MG/2ML IJ SOLN: 4.0000 mg | Freq: Once | INTRAMUSCULAR | Status: DC | PRN

## 2019-09-30 MED ORDER — PROPOFOL 500 MG/50ML IV EMUL
INTRAVENOUS | Status: AC
  Filled 2019-09-30: qty 50

## 2019-09-30 MED ORDER — PROPOFOL 500 MG/50ML IV EMUL
INTRAVENOUS | Status: DC | PRN
  Administered 2019-09-30: 75 ug/kg/min via INTRAVENOUS

## 2019-09-30 MED ORDER — ROPIVACAINE HCL 5 MG/ML IJ SOLN
INTRAMUSCULAR | Status: DC | PRN
  Administered 2019-09-30: 30 mL via PERINEURAL

## 2019-09-30 MED ORDER — OXYCODONE HCL 5 MG/5ML PO SOLN: 5.0000 mg | Freq: Once | ORAL | Status: DC | PRN

## 2019-09-30 MED ORDER — LIDOCAINE 2% (20 MG/ML) 5 ML SYRINGE
INTRAMUSCULAR | Status: AC
  Filled 2019-09-30: qty 5

## 2019-09-30 MED ORDER — CEFAZOLIN SODIUM-DEXTROSE 2-4 GM/100ML-% IV SOLN
2.0000 g | INTRAVENOUS | Status: AC
  Administered 2019-09-30: 2 g via INTRAVENOUS

## 2019-09-30 MED ORDER — DEXAMETHASONE SODIUM PHOSPHATE 10 MG/ML IJ SOLN
INTRAMUSCULAR | Status: AC
  Filled 2019-09-30: qty 1

## 2019-09-30 MED ORDER — FENTANYL CITRATE (PF) 100 MCG/2ML IJ SOLN: 25.0000 ug | INTRAMUSCULAR | Status: DC | PRN

## 2019-09-30 MED ORDER — ONDANSETRON HCL 4 MG/2ML IJ SOLN
INTRAMUSCULAR | Status: DC | PRN
  Administered 2019-09-30: 4 mg via INTRAVENOUS

## 2019-09-30 MED ORDER — LIDOCAINE 2% (20 MG/ML) 5 ML SYRINGE
INTRAMUSCULAR | Status: DC | PRN
  Administered 2019-09-30: 40 mg via INTRAVENOUS

## 2019-09-30 MED ORDER — OXYCODONE HCL 5 MG PO TABS: 5.0000 mg | ORAL_TABLET | Freq: Once | ORAL | Status: DC | PRN

## 2019-09-30 MED ORDER — LACTATED RINGERS IV SOLN: INTRAVENOUS | Status: DC

## 2019-09-30 MED ORDER — DEXAMETHASONE SODIUM PHOSPHATE 10 MG/ML IJ SOLN
INTRAMUSCULAR | Status: DC | PRN
  Administered 2019-09-30: 10 mg

## 2019-09-30 MED ORDER — HYDROCODONE-ACETAMINOPHEN 5-325 MG PO TABS: 1.0000 | ORAL_TABLET | Freq: Four times a day (QID) | ORAL | 0 refills | Status: AC | PRN

## 2019-09-30 SURGICAL SUPPLY — 52 items
BLADE SURG 15 STRL LF DISP TIS (BLADE) ×2 IMPLANT
BLADE SURG 15 STRL SS (BLADE) ×6
BNDG CMPR 9X4 STRL LF SNTH (GAUZE/BANDAGES/DRESSINGS) ×1
BNDG CONFORM 2 STRL LF (GAUZE/BANDAGES/DRESSINGS) IMPLANT
BNDG ELASTIC 3X5.8 VLCR STR LF (GAUZE/BANDAGES/DRESSINGS) ×3 IMPLANT
BNDG ESMARK 4X9 LF (GAUZE/BANDAGES/DRESSINGS) ×3 IMPLANT
BNDG GAUZE ELAST 4 BULKY (GAUZE/BANDAGES/DRESSINGS) IMPLANT
CANISTER SUCT 1200ML W/VALVE (MISCELLANEOUS) ×3 IMPLANT
CORD BIPOLAR FORCEPS 12FT (ELECTRODE) ×3 IMPLANT
COVER BACK TABLE 60X90IN (DRAPES) ×3 IMPLANT
COVER WAND RF STERILE (DRAPES) IMPLANT
CUFF TOURN SGL QUICK 18X4 (TOURNIQUET CUFF) IMPLANT
DECANTER SPIKE VIAL GLASS SM (MISCELLANEOUS) IMPLANT
DRAPE EXTREMITY T 121X128X90 (DISPOSABLE) ×3 IMPLANT
DRAPE IMP U-DRAPE 54X76 (DRAPES) ×3 IMPLANT
DRAPE SURG 17X23 STRL (DRAPES) ×3 IMPLANT
GAUZE 4X4 16PLY RFD (DISPOSABLE) IMPLANT
GAUZE XEROFORM 1X8 LF (GAUZE/BANDAGES/DRESSINGS) ×3 IMPLANT
GLOVE BIO SURGEON STRL SZ 6.5 (GLOVE) ×1 IMPLANT
GLOVE BIO SURGEONS STRL SZ 6.5 (GLOVE) ×1
GLOVE BIOGEL PI IND STRL 7.0 (GLOVE) IMPLANT
GLOVE BIOGEL PI IND STRL 8 (GLOVE) IMPLANT
GLOVE BIOGEL PI INDICATOR 7.0 (GLOVE) ×2
GLOVE BIOGEL PI INDICATOR 8 (GLOVE) ×2
GLOVE SURG SYN 7.5  E (GLOVE) ×3
GLOVE SURG SYN 7.5 E (GLOVE) ×1 IMPLANT
GLOVE SURG SYN 7.5 PF PI (GLOVE) IMPLANT
GOWN STRL REUS W/ TWL LRG LVL3 (GOWN DISPOSABLE) ×2 IMPLANT
GOWN STRL REUS W/TWL LRG LVL3 (GOWN DISPOSABLE) ×6
NDL HYPO 25X1 1.5 SAFETY (NEEDLE) ×1 IMPLANT
NEEDLE HYPO 22GX1.5 SAFETY (NEEDLE) IMPLANT
NEEDLE HYPO 25X1 1.5 SAFETY (NEEDLE) IMPLANT
NS IRRIG 1000ML POUR BTL (IV SOLUTION) ×3 IMPLANT
PADDING CAST ABS 3INX4YD NS (CAST SUPPLIES) ×2
PADDING CAST ABS 4INX4YD NS (CAST SUPPLIES) ×2
PADDING CAST ABS COTTON 3X4 (CAST SUPPLIES) ×1 IMPLANT
PADDING CAST ABS COTTON 4X4 ST (CAST SUPPLIES) ×1 IMPLANT
SET BASIN DAY SURGERY F.S. (CUSTOM PROCEDURE TRAY) ×3 IMPLANT
SHEET MEDIUM DRAPE 40X70 STRL (DRAPES) ×1 IMPLANT
SLEEVE SCD COMPRESS KNEE MED (MISCELLANEOUS) ×3 IMPLANT
SLING ARM FOAM STRAP XLG (SOFTGOODS) ×2 IMPLANT
SPLINT FIBERGLASS 3X35 (CAST SUPPLIES) ×2 IMPLANT
STOCKINETTE SYNTHETIC 3 UNSTER (CAST SUPPLIES) ×1 IMPLANT
SUCTION FRAZIER HANDLE 10FR (MISCELLANEOUS)
SUCTION TUBE FRAZIER 10FR DISP (MISCELLANEOUS) IMPLANT
SUT PROLENE 4 0 PS 2 18 (SUTURE) ×3 IMPLANT
SYR BULB EAR ULCER 3OZ GRN STR (SYRINGE) ×3 IMPLANT
SYR CONTROL 10ML LL (SYRINGE) ×5 IMPLANT
TOWEL GREEN STERILE FF (TOWEL DISPOSABLE) ×4 IMPLANT
TUBE CONNECTING 20'X1/4 (TUBING)
TUBE CONNECTING 20X1/4 (TUBING) IMPLANT
UNDERPAD 30X36 HEAVY ABSORB (UNDERPADS AND DIAPERS) ×3 IMPLANT

## 2019-09-30 NOTE — H&P (Signed)
ORTHOPAEDIC H&P  PCP:  [facility]  Chief Complaint: Right hand mass  HPI: Daniel Mcintosh is a 44 y.o. male who complains of a right hand palmar neck mass. He reports a several year history of the mass which has been slowly enlarging. It is located along the ulnar aspect of the palm. At first was quite small but since then has become increasingly large and more prominent. He has some pain and difficulty using the hand secondary to the size of the mass. He had an MRI performed which showed a fatty mass within the muscle of the hyperthenar region. We had a long discussion in clinic regarding treatment options and the patient did wish to proceed forward with surgical excision of the mass. We discussed the risks and benefits in clinic extensively and he did wish to proceed and presents today for excision of his right palm mass.  Past Medical History:  Diagnosis Date  . Asthma    Past Surgical History:  Procedure Laterality Date  . HAND SURGERY Right    Social History   Socioeconomic History  . Marital status: Single    Spouse name: Not on file  . Number of children: Not on file  . Years of education: Not on file  . Highest education level: Not on file  Occupational History  . Not on file  Tobacco Use  . Smoking status: Never Smoker  . Smokeless tobacco: Never Used  Substance and Sexual Activity  . Alcohol use: Yes    Comment: 3 days per week  . Drug use: No  . Sexual activity: Not on file  Other Topics Concern  . Not on file  Social History Narrative  . Not on file   Social Determinants of Health   Financial Resource Strain:   . Difficulty of Paying Living Expenses:   Food Insecurity:   . Worried About Charity fundraiser in the Last Year:   . Arboriculturist in the Last Year:   Transportation Needs:   . Film/video editor (Medical):   Marland Kitchen Lack of Transportation (Non-Medical):   Physical Activity:   . Days of Exercise per Week:   . Minutes of  Exercise per Session:   Stress:   . Feeling of Stress :   Social Connections:   . Frequency of Communication with Friends and Family:   . Frequency of Social Gatherings with Friends and Family:   . Attends Religious Services:   . Active Member of Clubs or Organizations:   . Attends Archivist Meetings:   Marland Kitchen Marital Status:    History reviewed. No pertinent family history. Allergies  Allergen Reactions  . Shrimp [Shellfish Allergy] Swelling   Prior to Admission medications   Medication Sig Start Date End Date Taking? Authorizing Provider  acetaminophen (TYLENOL) 500 MG tablet Take 1 tablet (500 mg total) by mouth every 6 (six) hours as needed. 11/09/14  Yes Domenic Moras, PA-C  albuterol (PROVENTIL HFA;VENTOLIN HFA) 108 (90 BASE) MCG/ACT inhaler Inhale 2 puffs into the lungs every 4 (four) hours as needed for wheezing or shortness of breath. 07/06/14  Yes Daniel Nones, [place], PA-C  amoxicillin (AMOXIL) 500 MG capsule Take 1 capsule (500 mg total) by mouth 3 (three) times daily. 02/18/18   Antonietta Breach, PA-C  famotidine (PEPCID) 20 MG tablet Take 1 tablet (20 mg total) by mouth 2 (two) times daily. 11/06/17   Nuala Alpha A, PA-C  promethazine (PHENERGAN) 25 MG tablet Take 1  tablet (25 mg total) by mouth every 6 (six) hours as needed for nausea. Patient not taking: Reported on 11/06/2017 11/09/14   Domenic Moras, PA-C  triamcinolone ointment (KENALOG) 0.5 % Apply 1 application topically 2 (two) times daily. Patient not taking: Reported on 11/06/2017 12/04/15   Etta Quill, NP   No results found.  Positive ROS: All other systems have been reviewed and were otherwise negative with the exception of those mentioned in the HPI and as above.  Physical Exam: General: Alert, no acute distress Cardiovascular: No pedal edema Respiratory: No cyanosis, no use of accessory musculature Skin: No lesions in the area of chief complaint Psychiatric: Patient is competent for consent with normal mood and  affect  MUSCULOSKELETAL: Examination of the right hand shows a soft tissue prominence of the hypothenar region. This is primarily palmarly. There is significant asymmetry of his right hand when compared to his left. Along the palmar, hypothenar area proximal to the fourth and fifth digits, there is a soft tissue mass in the hand. This does measure to be approximately 3 x 3 cm in size. The mass itself is soft and fleshy. It is non-fluctuant. It is nontender. There is no overlying erythema or signs of infection. He has no tenderness elsewhere in the hand or digits. He has full range of motion of the digits including flexion and extension. Additionally, he has full range of motion of the wrist including flexion, extension, pronation and supination when compared to the contralateral side. Clinical photo of the hand is included in the chart.  Assessment: Right palm, hyporthenar deep mass  Plan: Plan to proceed forward with surgical excision of his right palm deep mass.  The risks, benefits and alternatives were once again discussed with patient. These include but not limited to infection, bleeding, damage to surrounding structures including blood vessels and nerves, pain, stiffness, recurrence and need for additional procedures. Informed consent was obtained at that time.  Plan for discharge home postoperatively and follow-up with me in approximately 10 to 14 days for removal of his sutures and to begin occupational therapy to regain range of motion and strength.    Verner Mould, MD [phone]   09/30/2019 8:14 AM

## 2019-09-30 NOTE — Progress Notes (Signed)
Assisted Dr. Witman with right, ultrasound guided, supraclavicular block. Side rails up, monitors on throughout procedure. See vital signs in flow sheet. Tolerated Procedure well. 

## 2019-09-30 NOTE — Discharge Instructions (Signed)
Discharge Instructions  - Keep dressings in place. Do not remove them. - The dressings must stay dry - Take all medication as prescribed. Transition to over the counter pain medication as your pain improves - Keep the hand elevated over the next 48-72 hours to help with pain and swelling - Move all digits not restricted by the dressings regularly to prevent stiffness - Please call to schedule a follow up appointment with Dr. Jeannie Fend and therapy at [phone] for 10-14 days following surgery - Your pain medication have been send digitally to your pharmacy    Post Anesthesia Home Care Instructions  Activity: Get plenty of rest for the remainder of the day. A responsible individual must stay with you for 24 hours following the procedure.  For the next 24 hours, DO NOT: -Drive a car -Paediatric nurse -Drink alcoholic beverages -Take any medication unless instructed by your physician -Make any legal decisions or sign important papers.  Meals: Start with liquid foods such as gelatin or soup. Progress to regular foods as tolerated. Avoid greasy, spicy, heavy foods. If nausea and/or vomiting occur, drink only clear liquids until the nausea and/or vomiting subsides. Call your physician if vomiting continues.  Special Instructions/Symptoms: Your throat may feel dry or sore from the anesthesia or the breathing tube placed in your throat during surgery. If this causes discomfort, gargle with warm salt water. The discomfort should disappear within 24 hours.    Regional Anesthesia Blocks  1. Numbness or the inability to move the "blocked" extremity may last from 3-48 hours after placement. The length of time depends on the medication injected and your individual response to the medication. If the numbness is not going away after 48 hours, call your surgeon.  2. The extremity that is blocked will need to be protected until the numbness is gone and the  Strength has returned. Because you  cannot feel it, you will need to take extra care to avoid injury. Because it may be weak, you may have difficulty moving it or using it. You may not know what position it is in without looking at it while the block is in effect.  3. For blocks in the legs and feet, returning to weight bearing and walking needs to be done carefully. You will need to wait until the numbness is entirely gone and the strength has returned. You should be able to move your leg and foot normally before you try and bear weight or walk. You will need someone to be with you when you first try to ensure you do not fall and possibly risk injury.  4. Bruising and tenderness at the needle site are common side effects and will resolve in a few days.  5. Persistent numbness or new problems with movement should be communicated to the surgeon or the [facility] [phone] [place] [phone]).

## 2019-09-30 NOTE — Anesthesia Preprocedure Evaluation (Addendum)
Anesthesia Evaluation  Patient identified by MRN, date of birth, ID band Patient awake    Reviewed: Allergy & Precautions, NPO status , Patient's Chart, lab work & pertinent test results  History of Anesthesia Complications Negative for: history of anesthetic complications  Airway Mallampati: II  TM Distance: >3 FB Neck ROM: Full    Dental   Pulmonary asthma ,    Pulmonary exam normal        Cardiovascular negative cardio ROS Normal cardiovascular exam     Neuro/Psych negative neurological ROS  negative psych ROS   GI/Hepatic negative GI ROS, Neg liver ROS,   Endo/Other  negative endocrine ROS  Renal/GU negative Renal ROS  negative genitourinary   Musculoskeletal negative musculoskeletal ROS (+)   Abdominal   Peds  Hematology negative hematology ROS (+)   Anesthesia Other Findings   Reproductive/Obstetrics                            Anesthesia Physical Anesthesia Plan  ASA: II  Anesthesia Plan: MAC and Regional   Post-op Pain Management:  Regional for Post-op pain   Induction: Intravenous  PONV Risk Score and Plan: 1 and Propofol infusion, TIVA and Treatment may vary due to age or medical condition  Airway Management Planned: Natural Airway, Nasal Cannula and Simple Face Mask  Additional Equipment: None  Intra-op Plan:   Post-operative Plan:   Informed Consent: I have reviewed the patients History and Physical, chart, labs and discussed the procedure including the risks, benefits and alternatives for the proposed anesthesia with the patient or authorized representative who has indicated his/her understanding and acceptance.       Plan Discussed with:   Anesthesia Plan Comments:         Anesthesia Quick Evaluation

## 2019-09-30 NOTE — Anesthesia Procedure Notes (Signed)
Anesthesia Regional Block: Supraclavicular block   Pre-Anesthetic Checklist: ,, timeout performed, Correct Patient, Correct Site, Correct Laterality, Correct Procedure, Correct Position, site marked, Risks and benefits discussed,  Surgical consent,  Pre-op evaluation,  At surgeon's request and post-op pain management  Laterality: Right  Prep: chloraprep       Needles:  Injection technique: Single-shot  Needle Type: Echogenic Stimulator Needle     Needle Length: 10cm  Needle Gauge: 20     Additional Needles:   Procedures:,,,, ultrasound used (permanent image in chart),,,,  Narrative:  Start time: 09/30/2019 8:19 AM End time: 09/30/2019 8:23 AM Injection made incrementally with aspirations every 5 mL.  Performed by: Personally  Anesthesiologist: Lidia Collum, MD  Additional Notes: Monitors applied. Injection made in 5cc increments. No resistance to injection. Good needle visualization. Patient tolerated procedure well.

## 2019-09-30 NOTE — Op Note (Signed)
PREOPERATIVE DIAGNOSIS: Right deep palmar, hypothenar mass  POSTOPERATIVE DIAGNOSIS: Same  ATTENDING PHYSICIAN: Maudry Mayhew. Jeannie Fend, III, MD who was present and scrubbed for the entire case   ASSISTANT SURGEON: None.   ANESTHESIA: Regional with MAC  SURGICAL PROCEDURES:  1. Excision of deep, intramuscular palmar mass measuring 4.5 x 3 x 2 cm in size  SURGICAL INDICATIONS: Patient is a 44 year old male who was seen and evaluated by me in clinic.  He had a several year history of slowly enlarging mass within his right palm.  It was within the hypothenar region of the palm.  It had started to become more painful especially with a forceful gripping activities with the right hand as well as attempted to do push-ups.  He had a MRI of the hand performed which showed a fatty tumor within the muscle of the hypothenar region deep in the palm.  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  We had a long discussion regarding treatment options including both operative and nonoperative management.  Nonoperative management would include continued conservative observation and repeat exams of his hand.  Alternatively we discussed proceeding forward with a surgical excision of the mass.  We had a long discussion regarding the risks and benefits of surgery and after having this discussion the patient did wish to proceed forward and presents today for that.  FINDING: There was a large, fatty mass deep within the hypothenar musculature of the palm.  Successful excision of the mass was achieved and it measured to be 4.5 x 3 x 2 cm in size  DESCRIPTION OF PROCEDURE: The patient was identified in the preoperative holding area where the risk benefits and alternatives of the procedure were once again discussed with the patient.  These include but are not limited to infection, bleeding, damage to surrounding structures including blood vessels and nerves, pain, stiffness, recurrence and need for  additional procedures.  Informed consent was obtained at that time the patient's right hand was marked with a surgical marking pen.  He then underwent a right upper extremity plexus block by anesthesia.  He was brought to the operative suite where timeout was performed identifying the correct patient operative site.  Preoperative antibiotics were given.  He was induced under MAC sedation.  A tourniquet was placed on the upper arm and the arm was then prepped and draped in usual sterile fashion.  The limb was exsanguinated and the tourniquet was inflated.  A ulnarly based, triangular incision was made over the hypothenar region centered over the palpable mass in the palm.  Blunt dissection was carried down through the subcutaneous tissue.  The palmar fascia was identified and split directly over top the mass and hypothenar musculature.  Care was taken to protect visualized neurologic and vascular structures within the region.  There was thin layer of muscle fibers over top of the mass which were carefully dissected off the mass and mobilized and released.  This released a fatty lesion deep within the hypothenar musculature.  Careful, blunt dissection was performed circumferentially around the mass itself.  This allowed for mobilization of the mass.  It did extend radially and adjacent the carpal tunnel and deep near the palmar aspect of the carpal bones.  Blunt retractors were used to mobilize the mass itself until it was freed from the surrounding soft tissues.  This was done entirely through blunt dissection.  Once the mass was fully mobilized and released it was removed from the wound.  It did  measured to be 4.5 x 3 x 2 cm in size.  It was soft and fatty in nature did appear to be consistent with a lipoma.  At this point the wound was copiously irrigated with normal saline.  The tourniquet was released and hemostasis was achieved.  Patient had return of brisk capillary refill to all of the digits.  The wound was  closed with interrupted 4-0 Prolene sutures.  Xeroform, 4 x 4's and a bulky, well-padded volar slab splint were then placed.  The patient was awoken from his anesthesia and taken the PACU in stable condition.  He tolerated the procedure well and there were no complications.  ESTIMATED BLOOD LOSS: 20 mL  TOURNIQUET TIME: 19 minutes  SPECIMENS: Right palm mass  POSTOPERATIVE PLAN: The patient will be discharged home and seen back in the office in approximately 10-12 days for wound check, suture removal, and then be sent to a therapist for edema control, scar management as well as early range of motion exercises to the hand and wrist.  IMPLANTS: None

## 2019-09-30 NOTE — Transfer of Care (Signed)
Immediate Anesthesia Transfer of Care Note  Patient: Daniel Mcintosh  Procedure(s) Performed: Right hand deep mass excision and surgery as indicated (Right Hand)  Patient Location: PACU  Anesthesia Type:MAC combined with regional for post-op pain  Level of Consciousness: awake, alert  and oriented  Airway & Oxygen Therapy: Patient Spontanous Breathing and Patient connected to face mask oxygen  Post-op Assessment: Report given to RN and Post -op Vital signs reviewed and stable  Post vital signs: Reviewed and stable  Last Vitals:  Vitals Value Taken Time  BP 147/79 09/30/19 0930  Temp 36.6 C 09/30/19 0930  Pulse 75 09/30/19 0930  Resp 18 09/30/19 0930  SpO2 97 % 09/30/19 0930    Last Pain:  Vitals:   09/30/19 0727  TempSrc: Oral  PainSc: 0-No pain      Patients Stated Pain Goal: 1 (49/17/91 [id number])  Complications: No apparent anesthesia complications

## 2019-09-30 NOTE — Anesthesia Postprocedure Evaluation (Signed)
Anesthesia Post Note  Patient: Daniel Mcintosh  Procedure(s) Performed: Right hand deep mass excision and surgery as indicated (Right Hand)     Patient location during evaluation: PACU Anesthesia Type: Regional Level of consciousness: awake and alert Pain management: pain level controlled Vital Signs Assessment: post-procedure vital signs reviewed and stable Respiratory status: spontaneous breathing, nonlabored ventilation and respiratory function stable Cardiovascular status: blood pressure returned to baseline and stable Postop Assessment: no apparent nausea or vomiting Anesthetic complications: no    Last Vitals:  Vitals:   09/30/19 0945 09/30/19 0957  BP: (!) 162/91 (!) 141/94  Pulse: 67 76  Resp: 15 18  Temp:  36.5 C  SpO2: 96% 95%    Last Pain:  Vitals:   09/30/19 0957  TempSrc: Oral  PainSc: 0-No pain                 Lidia Collum

## 2019-10-01 ENCOUNTER — Encounter: Payer: Self-pay | Admitting: *Deleted

## 2019-10-01 LAB — SURGICAL PATHOLOGY

## 2021-07-10 ENCOUNTER — Encounter (HOSPITAL_COMMUNITY): Payer: Self-pay

## 2021-07-10 ENCOUNTER — Emergency Department (HOSPITAL_COMMUNITY): Payer: Self-pay

## 2021-07-10 ENCOUNTER — Emergency Department (HOSPITAL_COMMUNITY)
Admission: EM | Admit: 2021-07-10 | Discharge: 2021-07-10 | Disposition: A | Discharge status: Home / Self Care | Payer: Self-pay | Attending: Emergency Medicine | Admitting: Emergency Medicine

## 2021-07-10 ENCOUNTER — Other Ambulatory Visit: Payer: Self-pay

## 2021-07-10 DIAGNOSIS — S39012A Strain of muscle, fascia and tendon of lower back, initial encounter: Secondary | ICD-10-CM | POA: Insufficient documentation

## 2021-07-10 DIAGNOSIS — S3992XA Unspecified injury of lower back, initial encounter: Secondary | ICD-10-CM | POA: Diagnosis present

## 2021-07-10 DIAGNOSIS — Y92410 Unspecified street and highway as the place of occurrence of the external cause: Secondary | ICD-10-CM | POA: Insufficient documentation

## 2021-07-10 DIAGNOSIS — J45909 Unspecified asthma, uncomplicated: Secondary | ICD-10-CM | POA: Diagnosis not present

## 2021-07-10 MED ORDER — IBUPROFEN 800 MG PO TABS
800.0000 mg | ORAL_TABLET | Freq: Once | ORAL | Status: AC
  Administered 2021-07-10: 800 mg via ORAL
  Filled 2021-07-10: qty 1

## 2021-07-10 MED ORDER — METHOCARBAMOL 500 MG PO TABS: 500.0000 mg | ORAL_TABLET | Freq: Three times a day (TID) | ORAL | 0 refills | Status: AC

## 2021-07-10 MED ORDER — IBUPROFEN 800 MG PO TABS: 800.0000 mg | ORAL_TABLET | Freq: Three times a day (TID) | ORAL | 0 refills | Status: AC

## 2021-07-10 MED ORDER — METHOCARBAMOL 500 MG PO TABS
500.0000 mg | ORAL_TABLET | Freq: Once | ORAL | Status: AC
  Administered 2021-07-10: 500 mg via ORAL
  Filled 2021-07-10: qty 1
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
## 2021-07-10 NOTE — ED Notes (Signed)
Patient transported to X-ray 
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
## 2021-07-10 NOTE — Discharge Instructions (Signed)
Apply ice packs on and off to your back.  Avoid bending twisting or lifting movements for 1 week.  Follow-up with your primary care provider for recheck return to emergency department for any new or worsening symptoms. ?
